# Patient Record
Sex: Male | Born: 1990
Health system: Southern US, Community
[De-identification: ages and names within clinical notes are randomized; demographics above are authoritative.]

## PROBLEM LIST (undated history)

## (undated) DIAGNOSIS — J189 Pneumonia, unspecified organism: Secondary | ICD-10-CM

---

## 2017-03-09 ENCOUNTER — Inpatient Hospital Stay (HOSPITAL_COMMUNITY): Payer: 59

## 2017-03-09 ENCOUNTER — Encounter (HOSPITAL_COMMUNITY): Payer: Self-pay | Admitting: Emergency Medicine

## 2017-03-09 ENCOUNTER — Emergency Department (HOSPITAL_COMMUNITY): Payer: 59

## 2017-03-09 ENCOUNTER — Inpatient Hospital Stay (HOSPITAL_COMMUNITY)
Admission: EM | Admit: 2017-03-09 | Discharge: 2017-03-14 | DRG: 871 | Disposition: A | Discharge status: Home / Self Care | Payer: 59 | Attending: Internal Medicine | Admitting: Internal Medicine

## 2017-03-09 DIAGNOSIS — R042 Hemoptysis: Secondary | ICD-10-CM

## 2017-03-09 DIAGNOSIS — R7989 Other specified abnormal findings of blood chemistry: Secondary | ICD-10-CM | POA: Diagnosis not present

## 2017-03-09 DIAGNOSIS — R05 Cough: Secondary | ICD-10-CM | POA: Diagnosis not present

## 2017-03-09 DIAGNOSIS — J181 Lobar pneumonia, unspecified organism: Secondary | ICD-10-CM | POA: Diagnosis not present

## 2017-03-09 DIAGNOSIS — R509 Fever, unspecified: Secondary | ICD-10-CM | POA: Diagnosis not present

## 2017-03-09 DIAGNOSIS — J189 Pneumonia, unspecified organism: Secondary | ICD-10-CM

## 2017-03-09 DIAGNOSIS — R0902 Hypoxemia: Secondary | ICD-10-CM | POA: Diagnosis not present

## 2017-03-09 DIAGNOSIS — J9601 Acute respiratory failure with hypoxia: Secondary | ICD-10-CM | POA: Diagnosis present

## 2017-03-09 DIAGNOSIS — B9789 Other viral agents as the cause of diseases classified elsewhere: Secondary | ICD-10-CM | POA: Diagnosis present

## 2017-03-09 DIAGNOSIS — R0600 Dyspnea, unspecified: Secondary | ICD-10-CM | POA: Diagnosis not present

## 2017-03-09 DIAGNOSIS — A419 Sepsis, unspecified organism: Principal | ICD-10-CM

## 2017-03-09 DIAGNOSIS — R0789 Other chest pain: Secondary | ICD-10-CM | POA: Diagnosis present

## 2017-03-09 DIAGNOSIS — N179 Acute kidney failure, unspecified: Secondary | ICD-10-CM | POA: Diagnosis not present

## 2017-03-09 DIAGNOSIS — E872 Acidosis: Secondary | ICD-10-CM | POA: Diagnosis present

## 2017-03-09 DIAGNOSIS — R0682 Tachypnea, not elsewhere classified: Secondary | ICD-10-CM | POA: Diagnosis not present

## 2017-03-09 DIAGNOSIS — R079 Chest pain, unspecified: Secondary | ICD-10-CM

## 2017-03-09 DIAGNOSIS — R000 Tachycardia, unspecified: Secondary | ICD-10-CM | POA: Diagnosis present

## 2017-03-09 DIAGNOSIS — J1289 Other viral pneumonia: Secondary | ICD-10-CM | POA: Diagnosis not present

## 2017-03-09 DIAGNOSIS — R0602 Shortness of breath: Secondary | ICD-10-CM

## 2017-03-09 LAB — BASIC METABOLIC PANEL
Anion gap: 11 (ref 5–15)
BUN: 17 mg/dL (ref 6–20)
CO2: 23 mmol/L (ref 22–32)
Calcium: 8 mg/dL — ABNORMAL LOW (ref 8.9–10.3)
Chloride: 106 mmol/L (ref 101–111)
Creatinine, Ser: 1.26 mg/dL — ABNORMAL HIGH (ref 0.61–1.24)
GFR calc Af Amer: >60 mL/min (ref >60)
Glucose, Bld: 100 mg/dL — ABNORMAL HIGH (ref 65–99)
POTASSIUM: 4.4 mmol/L (ref 3.5–5.1)
SODIUM: 140 mmol/L (ref 135–145)

## 2017-03-09 LAB — I-STAT CG4 LACTIC ACID, ED
LACTIC ACID, VENOUS: 4.87 mmol/L — AB (ref 0.5–1.9)
Lactic Acid, Venous: 3.96 mmol/L (ref 0.5–1.9)

## 2017-03-09 LAB — COMPREHENSIVE METABOLIC PANEL
ALBUMIN: 3.8 g/dL (ref 3.5–5.0)
ALK PHOS: 58 U/L (ref 38–126)
ALT: 20 U/L (ref 17–63)
AST: 31 U/L (ref 15–41)
Anion gap: 13 (ref 5–15)
BILIRUBIN TOTAL: 1.8 mg/dL — AB (ref 0.3–1.2)
BUN: 21 mg/dL — AB (ref 6–20)
CALCIUM: 8.8 mg/dL — AB (ref 8.9–10.3)
CO2: 20 mmol/L — AB (ref 22–32)
CREATININE: 1.6 mg/dL — AB (ref 0.61–1.24)
Chloride: 103 mmol/L (ref 101–111)
GFR calc Af Amer: >60 mL/min (ref >60)
GFR calc non Af Amer: 58 mL/min — ABNORMAL LOW (ref >60)
GLUCOSE: 108 mg/dL — AB (ref 65–99)
Potassium: 4.2 mmol/L (ref 3.5–5.1)
SODIUM: 136 mmol/L (ref 135–145)
TOTAL PROTEIN: 6.8 g/dL (ref 6.5–8.1)

## 2017-03-09 LAB — URINALYSIS, ROUTINE W REFLEX MICROSCOPIC
BACTERIA UA: NONE SEEN
BILIRUBIN URINE: NEGATIVE
GLUCOSE, UA: NEGATIVE mg/dL
HGB URINE DIPSTICK: NEGATIVE
Ketones, ur: NEGATIVE mg/dL
LEUKOCYTES UA: NEGATIVE
NITRITE: NEGATIVE
PROTEIN: 100 mg/dL — AB
SPECIFIC GRAVITY, URINE: 1.028 (ref 1.005–1.030)
pH: 5 (ref 5.0–8.0)

## 2017-03-09 LAB — CBC
HCT: 42.9 % (ref 39.0–52.0)
Hemoglobin: 15.1 g/dL (ref 13.0–17.0)
MCH: 28.4 pg (ref 26.0–34.0)
MCHC: 35.2 g/dL (ref 30.0–36.0)
MCV: 80.6 fL (ref 78.0–100.0)
Platelets: 245 10*3/uL (ref 150–400)
RBC: 5.32 MIL/uL (ref 4.22–5.81)
RDW: 13.3 % (ref 11.5–15.5)
WBC: 16.1 10*3/uL — ABNORMAL HIGH (ref 4.0–10.5)

## 2017-03-09 LAB — DIFFERENTIAL
BASOS PCT: 0 %
Basophils Absolute: 0 10*3/uL (ref 0.0–0.1)
EOS PCT: 0 %
Eosinophils Absolute: 0 10*3/uL (ref 0.0–0.7)
LYMPHS ABS: 0.6 10*3/uL — AB (ref 0.7–4.0)
Lymphocytes Relative: 4 %
MONOS PCT: 6 %
Monocytes Absolute: 1 10*3/uL (ref 0.1–1.0)
Neutro Abs: 14.5 10*3/uL — ABNORMAL HIGH (ref 1.7–7.7)
Neutrophils Relative %: 90 %

## 2017-03-09 LAB — INFLUENZA PANEL BY PCR (TYPE A & B)
Influenza A By PCR: NEGATIVE
Influenza B By PCR: NEGATIVE

## 2017-03-09 LAB — I-STAT TROPONIN, ED: Troponin i, poc: 0 ng/mL (ref 0.00–0.08)

## 2017-03-09 LAB — MRSA PCR SCREENING: MRSA BY PCR: NEGATIVE

## 2017-03-09 LAB — D-DIMER, QUANTITATIVE (NOT AT ARMC): D DIMER QUANT: 0.51 ug{FEU}/mL — AB (ref 0.00–0.50)

## 2017-03-09 LAB — LIPASE, BLOOD: Lipase: 19 U/L (ref 11–51)

## 2017-03-09 LAB — D-DIMER, QUANTITATIVE: D-Dimer, Quant: 0.81 ug/mL-FEU — ABNORMAL HIGH (ref 0.00–0.50)

## 2017-03-09 MED ORDER — ALBUTEROL SULFATE (2.5 MG/3ML) 0.083% IN NEBU: 3.0000 mL | INHALATION_SOLUTION | Freq: Four times a day (QID) | RESPIRATORY_TRACT | Status: DC | PRN

## 2017-03-09 MED ORDER — ONDANSETRON HCL 4 MG/2ML IJ SOLN
4.0000 mg | Freq: Once | INTRAMUSCULAR | Status: AC
  Administered 2017-03-09: 4 mg via INTRAVENOUS
  Filled 2017-03-09: qty 2

## 2017-03-09 MED ORDER — BENZONATATE 100 MG PO CAPS
100.0000 mg | ORAL_CAPSULE | Freq: Three times a day (TID) | ORAL | Status: DC | PRN
  Administered 2017-03-10 – 2017-03-14 (×10): 100 mg via ORAL
  Filled 2017-03-09 (×13): qty 1

## 2017-03-09 MED ORDER — ACETAMINOPHEN 325 MG PO TABS
650.0000 mg | ORAL_TABLET | Freq: Four times a day (QID) | ORAL | Status: DC | PRN
  Administered 2017-03-09 – 2017-03-13 (×10): 650 mg via ORAL
  Filled 2017-03-09 (×11): qty 2

## 2017-03-09 MED ORDER — VANCOMYCIN HCL IN DEXTROSE 1-5 GM/200ML-% IV SOLN
1000.0000 mg | Freq: Once | INTRAVENOUS | Status: AC
  Administered 2017-03-09: 1000 mg via INTRAVENOUS
  Filled 2017-03-09: qty 200

## 2017-03-09 MED ORDER — VANCOMYCIN HCL IN DEXTROSE 750-5 MG/150ML-% IV SOLN: 750.0000 mg | Freq: Two times a day (BID) | INTRAVENOUS | Status: DC

## 2017-03-09 MED ORDER — ACETAMINOPHEN 650 MG RE SUPP: 650.0000 mg | Freq: Four times a day (QID) | RECTAL | Status: DC | PRN

## 2017-03-09 MED ORDER — AZITHROMYCIN 500 MG PO TABS
500.0000 mg | ORAL_TABLET | Freq: Every day | ORAL | Status: AC
  Administered 2017-03-09: 500 mg via ORAL
  Filled 2017-03-09: qty 1

## 2017-03-09 MED ORDER — VANCOMYCIN HCL IN DEXTROSE 1-5 GM/200ML-% IV SOLN: 1000.0000 mg | Freq: Three times a day (TID) | INTRAVENOUS | Status: DC

## 2017-03-09 MED ORDER — LACTATED RINGERS IV BOLUS (SEPSIS)
1000.0000 mL | Freq: Once | INTRAVENOUS | Status: AC
  Administered 2017-03-09: 1000 mL via INTRAVENOUS

## 2017-03-09 MED ORDER — DEXTROSE 5 % IV SOLN
2.0000 g | INTRAVENOUS | Status: DC
  Administered 2017-03-09: 2 g via INTRAVENOUS
  Filled 2017-03-09: qty 2

## 2017-03-09 MED ORDER — SENNOSIDES-DOCUSATE SODIUM 8.6-50 MG PO TABS: 1.0000 | ORAL_TABLET | Freq: Every evening | ORAL | Status: DC | PRN

## 2017-03-09 MED ORDER — SODIUM CHLORIDE 0.9 % IV SOLN
INTRAVENOUS | Status: AC
  Administered 2017-03-09: 125 mL/h via INTRAVENOUS
  Administered 2017-03-10 (×2): via INTRAVENOUS

## 2017-03-09 MED ORDER — LACTATED RINGERS IV BOLUS (SEPSIS): 1000.0000 mL | Freq: Once | INTRAVENOUS | Status: DC

## 2017-03-09 MED ORDER — PIPERACILLIN-TAZOBACTAM 3.375 G IVPB: 3.3750 g | Freq: Three times a day (TID) | INTRAVENOUS | Status: DC

## 2017-03-09 MED ORDER — SODIUM CHLORIDE 0.9 % IV BOLUS (SEPSIS)
1000.0000 mL | Freq: Once | INTRAVENOUS | Status: AC
  Administered 2017-03-09: 1000 mL via INTRAVENOUS

## 2017-03-09 MED ORDER — GUAIFENESIN-CODEINE 100-10 MG/5ML PO SOLN
5.0000 mL | Freq: Four times a day (QID) | ORAL | Status: DC | PRN
  Administered 2017-03-09 – 2017-03-14 (×15): 5 mL via ORAL
  Filled 2017-03-09 (×16): qty 5

## 2017-03-09 MED ORDER — SODIUM CHLORIDE 0.9 % IV SOLN: Freq: Once | INTRAVENOUS | Status: DC

## 2017-03-09 MED ORDER — IOPAMIDOL (ISOVUE-370) INJECTION 76%
INTRAVENOUS | Status: AC
  Administered 2017-03-09: 100 mL
  Filled 2017-03-09: qty 100

## 2017-03-09 MED ORDER — LACTATED RINGERS IV BOLUS (SEPSIS)
500.0000 mL | Freq: Once | INTRAVENOUS | Status: AC
  Administered 2017-03-09: 500 mL via INTRAVENOUS

## 2017-03-09 MED ORDER — AZITHROMYCIN 500 MG PO TABS
250.0000 mg | ORAL_TABLET | Freq: Every day | ORAL | Status: AC
  Administered 2017-03-10 – 2017-03-13 (×4): 250 mg via ORAL
  Filled 2017-03-09 (×5): qty 1

## 2017-03-09 MED ORDER — PIPERACILLIN-TAZOBACTAM 3.375 G IVPB 30 MIN
3.3750 g | Freq: Once | INTRAVENOUS | Status: AC
  Administered 2017-03-09: 3.375 g via INTRAVENOUS
  Filled 2017-03-09: qty 50

## 2017-03-09 MED ORDER — ALBUTEROL SULFATE (2.5 MG/3ML) 0.083% IN NEBU
2.5000 mg | INHALATION_SOLUTION | Freq: Four times a day (QID) | RESPIRATORY_TRACT | Status: DC | PRN
  Administered 2017-03-09 – 2017-03-11 (×3): 2.5 mg via RESPIRATORY_TRACT
  Filled 2017-03-09 (×3): qty 3

## 2017-03-09 MED ORDER — ACETAMINOPHEN 500 MG PO TABS
1000.0000 mg | ORAL_TABLET | Freq: Once | ORAL | Status: AC
  Administered 2017-03-09: 1000 mg via ORAL
  Filled 2017-03-09: qty 2

## 2017-03-09 MED ORDER — VANCOMYCIN HCL IN DEXTROSE 1-5 GM/200ML-% IV SOLN
1000.0000 mg | Freq: Once | INTRAVENOUS | Status: DC
  Filled 2017-03-09: qty 200

## 2017-03-09 MED ORDER — ALBUTEROL SULFATE (2.5 MG/3ML) 0.083% IN NEBU
2.5000 mg | INHALATION_SOLUTION | Freq: Four times a day (QID) | RESPIRATORY_TRACT | Status: DC
  Administered 2017-03-10 – 2017-03-11 (×5): 2.5 mg via RESPIRATORY_TRACT
  Filled 2017-03-09 (×5): qty 3

## 2017-03-09 NOTE — ED Notes (Signed)
Patient transported to X-ray 

## 2017-03-09 NOTE — Progress Notes (Addendum)
Pharmacy Antibiotic Note  Joshua Villegas is a 26 y.o. male admitted on 03/09/2017 with sepsis.  Pharmacy has been consulted for vancomycin and Zosyn dosing.  Patient has had cough, fever, and nasal congestion x3 days.  SCr 1.6- assume patient has small AKI based on age and no significant medical history. nCrCl ~70-3575mL/min  Plan: Vancomycin 1g IV and Zosyn 3.375g IV each x1 ordered by EDP Vancomycin 1g IV x1 again to complete load, then 750mg  IV q12h per obesity nomogram Zosyn 3.375g IV q8h EI Follow c/s, clinical progression, renal function, level at steady state   Height: 6\' 1"  (185.4 cm) Weight: 235 lb (106.6 kg) IBW/kg (Calculated) : 79.9  Temp (24hrs), Avg:100.4 F (38 C), Min:100.4 F (38 C), Max:100.4 F (38 C)  Recent Labs  Lab 03/09/17 1403 03/09/17 1424  WBC 16.1*  --   LATICACIDVEN  --  4.87*    CrCl cannot be calculated (No order found.).    Allergies not on file  Vanc 12/9>> Zosyn 12/9>>  12/9 BCx:  Thank you for allowing pharmacy to be a part of this patient's care.  Willam Munford D. Chenika Nevils, PharmD, BCPS Clinical Pharmacist Clinical Phone for 03/09/2017 until 3:30pm: [phone] If after 3:30pm, please call main pharmacy at [phone] 03/09/2017 2:48 PM

## 2017-03-09 NOTE — ED Triage Notes (Signed)
Pt. Stated, ai started having cough, congestion, fever, diarrhea, N/V 3 days ago. And I started having some blood when I cough.

## 2017-03-09 NOTE — ED Provider Notes (Addendum)
[facility] EMERGENCY DEPARTMENT Provider Note   CSN: [id number] Arrival date & time: 03/09/17  1339     History   Chief Complaint Chief Complaint  Patient presents with  . Code Sepsis  . Cough  . Chest Pain  . Fever  . Emesis    HPI Joshua Villegas is a 26 y.o. male.  HPI   26 yo M with no significant PMHx here with nasal congestion, cough, CP. Pt reports his sx started 3 days ago with acute onset of cough, fever, and nausea with vomiting. He was travelling in [place] at the time. Since then, he reports he's developed worsening cough with a sharp, pleuritic CP. He initially produced yellow-green sputum but is now producing bloody hemoptysis. He states he's coughed up "a lot" of blood. Denies any h/o similar sx. No smoking or alcohol use. He's had fevers. He's been unable to eat/drink much 2/2 his nausea and vomiting. His daughter does have "a cold" but no sx similar in severity. No h/o blood clots.  History reviewed. No pertinent past medical history.  There are no active problems to display for this patient.   History reviewed. No pertinent surgical history.     Home Medications    Prior to Admission medications   Medication Sig Start Date End Date Taking? Authorizing Provider  ibuprofen (ADVIL,MOTRIN) 200 MG tablet Take 400 mg by mouth every 6 (six) hours as needed for fever, headache or mild pain.   Yes [provider]    Family History No family history on file.  Social History Social History   Tobacco Use  . Smoking status: Never Smoker  . Smokeless tobacco: Never Used  Substance Use Topics  . Alcohol use: Yes  . Drug use: No     Allergies   Patient has no known allergies.   Review of Systems Review of Systems  Constitutional: Positive for chills, fatigue and fever.  HENT: Positive for congestion and rhinorrhea.   Respiratory: Positive for cough and shortness of breath.   Cardiovascular: Positive for chest pain.    Gastrointestinal: Positive for nausea and vomiting.  Neurological: Positive for weakness.  All other systems reviewed and are negative.    Physical Exam Updated Vital Signs BP (!) 110/43   Pulse (!) 102   Temp (!) 100.4 F (38 C) (Oral)   Resp 19   Ht 6\' 1"  (1.854 m)   Wt 106.6 kg (235 lb)   SpO2 96%   BMI 31.00 kg/m   Physical Exam  Constitutional: He is oriented to person, place, and time. He appears well-developed and well-nourished. He appears distressed.  HENT:  Head: Normocephalic and atraumatic.  Dry MM. Moderate posterior pharyngeal erythema. No tonsillar asymmetry or swelling.  Eyes: Conjunctivae are normal.  Neck: Neck supple.  Cardiovascular: Normal rate, regular rhythm and normal heart sounds. Exam reveals no friction rub.  No murmur heard. Pulmonary/Chest: Effort normal. Tachypnea noted. No respiratory distress. He has decreased breath sounds (diffuse) in the right middle field, the right lower field and the left lower field. He has no wheezes. He has rales in the right middle field and the right lower field.  Abdominal: He exhibits no distension.  Musculoskeletal: He exhibits no edema.  Neurological: He is alert and oriented to person, place, and time. He exhibits normal muscle tone.  Skin: Skin is warm. Capillary refill takes less than 2 seconds.  Psychiatric: He has a normal mood and affect.  Nursing note and vitals reviewed.  ED Treatments / Results  Labs (all labs ordered are listed, but only abnormal results are displayed) Labs Reviewed  COMPREHENSIVE METABOLIC PANEL - Abnormal; Notable for the following components:      Result Value   CO2 20 (*)    Glucose, Bld 108 (*)    BUN 21 (*)    Creatinine, Ser 1.60 (*)    Calcium 8.8 (*)    Total Bilirubin 1.8 (*)    GFR calc non Af Amer 58 (*)    All other components within normal limits  CBC - Abnormal; Notable for the following components:   WBC 16.1 (*)    All other components within normal  limits  D-DIMER, QUANTITATIVE (NOT AT [facility]) - Abnormal; Notable for the following components:   D-Dimer, Quant 0.51 (*)    All other components within normal limits  I-STAT CG4 LACTIC ACID, ED - Abnormal; Notable for the following components:   Lactic Acid, Venous 4.87 (*)    All other components within normal limits  CULTURE, BLOOD (ROUTINE X 2)  CULTURE, BLOOD (ROUTINE X 2)  LIPASE, BLOOD  URINALYSIS, ROUTINE W REFLEX MICROSCOPIC  INFLUENZA PANEL BY PCR (TYPE A & B)  DIFFERENTIAL  I-STAT TROPONIN, ED  I-STAT CG4 LACTIC ACID, ED    EKG  EKG Interpretation  Date/Time:  Sunday March 09 2017 13:45:24 EST Ventricular Rate:  124 PR Interval:  126 QRS Duration: 92 QT Interval:  302 QTC Calculation: 433 R Axis:   63 Text Interpretation:  Sinus tachycardia RSR' or QR pattern in V1 suggests right ventricular conduction delay T wave abnormality, consider inferolateral ischemia Abnormal ECG No old tracing to compare Changes consistent with possible demand ischemia, likely rate related No ST elevations S1Q3T3 noted Confirmed by Shaune PollackIsaacs, Cassie Shedlock [phone]) on 03/09/2017 1:52:34 PM Also confirmed by Shaune PollackIsaacs, Tiah Heckel [phone]), editor Madalyn RobEverhart, Marilyn [phone])  on 03/09/2017 3:03:55 PM       Radiology Dg Chest 2 View  Result Date: 03/09/2017 CLINICAL DATA:  Cough with blood-tinged sputum.  Chest pain.  Fever. EXAM: CHEST  2 VIEW COMPARISON:  None. FINDINGS: There is airspace consolidation throughout the anterior segment right upper lobe consistent with pneumonia. There is patchy opacity in the left base. Lungs elsewhere are clear. Heart size and pulmonary vascularity are normal. No adenopathy. No bone lesions. IMPRESSION: Airspace consolidation throughout the anterior segment right upper lobe without appreciable volume loss. This finding is consistent with pneumonia. There is rather subtle patchy opacity in the left base, likely a much smaller focus of pneumonia in this region. No adenopathy  appreciable. Electronically Signed   By: Bretta BangWilliam  Woodruff III M.D.   On: 03/09/2017 14:47    Procedures .Critical Care Performed by: Shaune PollackIsaacs, Cai Flott, MD Authorized by: Shaune PollackIsaacs, Nicle Connole, MD   Critical care provider statement:    Critical care time (minutes):  35   Critical care time was exclusive of:  Separately billable procedures and treating other patients and teaching time   Critical care was necessary to treat or prevent imminent or life-threatening deterioration of the following conditions:  Circulatory failure, sepsis and respiratory failure   Critical care was time spent personally by me on the following activities:  Development of treatment plan with patient or surrogate, discussions with consultants, evaluation of patient's response to treatment, examination of patient, obtaining history from patient or surrogate, ordering and performing treatments and interventions, ordering and review of laboratory studies, ordering and review of radiographic studies, pulse oximetry, re-evaluation of patient's condition and review of old  charts   I assumed direction of critical care for this patient from another provider in my specialty: no     (including critical care time)  Medications Ordered in ED Medications  vancomycin (VANCOCIN) IVPB 1000 mg/200 mL premix (1,000 mg Intravenous New Bag/Given 03/09/17 1508)  lactated ringers bolus 1,000 mL (1,000 mLs Intravenous New Bag/Given 03/09/17 1500)  lactated ringers bolus 500 mL (500 mLs Intravenous New Bag/Given 03/09/17 1459)  vancomycin (VANCOCIN) IVPB 1000 mg/200 mL premix (not administered)  piperacillin-tazobactam (ZOSYN) IVPB 3.375 g (not administered)  sodium chloride 0.9 % bolus 1,000 mL (1,000 mLs Intravenous New Bag/Given 03/09/17 1413)  sodium chloride 0.9 % bolus 1,000 mL (1,000 mLs Intravenous New Bag/Given 03/09/17 1414)  acetaminophen (TYLENOL) tablet 1,000 mg (1,000 mg Oral Given 03/09/17 1413)  ondansetron (ZOFRAN) injection 4 mg (4 mg  Intravenous Given 03/09/17 1413)  piperacillin-tazobactam (ZOSYN) IVPB 3.375 g (3.375 g Intravenous New Bag/Given 03/09/17 1506)     Initial Impression / Assessment and Plan / ED Course  I have reviewed the triage vital signs and the nursing notes.  Pertinent labs & imaging results that were available during my care of the patient were reviewed by me and considered in my medical decision making (see chart for details).     3:38 PM 26 yo M here with acute onset cough, myalgias, fever, n/v. He is febrile and tachycardic on arrival, in mild distress but non-toxic. Primary suspicion is influenza/ILI with possible PNA given his hemoptysis. However, he does report pleuritic CP with hemoptysis and EKG is c/f S1Q3T3. While this may be demand in setting of his SIRS response, must also consider PE. D-Dimer ordered. Will start fluids, tylenol, send LA and re-assess. Holding on ABX given viral like sx at this time but low threshold to add if LA elevated or CXR shows PNA. Sx started over last 48 hours, so time course is not c/w post-viral staph PNA but this is also a consideration - will f/u CXR.  3:38 PM LA elevated, code sepsis initiated with broad-spectrum ABX as source remains unclear at this time. CXR, labs o/w pending. He remains HDS, though tachycardic.  3:38 PM Reviewed CXR. Pt has focal, lobar PNA c/f post-viral PNA. Vanc/ broad-spectrum ABX have been given. IVF given. He is satting well on RA, o/w stable. Will plan for admission. Of note, D-Dimer 0.51 - given his CXR and lab findings, suspect PNA is likely etiology. Moreover, cannot obtain CT Angio 2/2 AKI. Will hydrate, monitor. I think that if he continues to improve with fluids, ABX, and has no hypoxia, can likely hold on CT.  ADDENDUM: LA improving but remains elevated - will continue fluids. Pt remains non-toxic, protecting airway on exam. BP 100s systolic. HR improving. CT Angio ordered as I think this will eval PE, also better delineate extent of  his pulm disease. IM to admit, has seen pt and in agreement. He is satting >92% on RA at this time. Will likely need Step Down due to potential for decompensation, but given normal O2 sats on RA, normal WOB, BP >100 systolic, do not feel ICU needed at this time.  Final Clinical Impressions(s) / ED Diagnoses   Final diagnoses:  Sepsis due to pneumonia [facility](HCC)    ED Discharge Orders    None       Shaune PollackIsaacs, Damaya Channing, MD 03/09/17 1538    Shaune PollackIsaacs, Agueda Houpt, MD 03/09/17 1700    Shaune PollackIsaacs, Norvel Wenker, MD 03/10/17 1131

## 2017-03-09 NOTE — ED Notes (Signed)
I Stat Lactic Acid results shown to Dr. Isaacs 

## 2017-03-09 NOTE — H&P (Signed)
Date: 03/09/2017               Patient Name:  Joshua Villegas MRN: [id number]  DOB: [birth year] Age / Sex: 26 y.o., male   PCP: Patient, No Pcp Per         Medical Service: Internal Medicine Teaching Service         Attending Physician: Dr. Duffy Bruce, MD    First Contact: Dr. Berline Lopes Pager: [phone]  Second Contact: Dr. Heber Thornton Pager: [phone]       After Hours (After 5p/  First Contact Pager: [phone]  weekends / holidays): Second Contact Pager: [phone]   Chief Complaint: chest pain, cough and shortness of breath  History of Present Illness: Joshua Villegas is a 26 year old male who presents today with dyspnea, hemoptysis, chest pain, fever, vomiting, anorexia, generalized fatigue 3 days.  He denies a past medical history and denies taking medication for any medical illness.  The patient states that prior to his return trip from [place] early Friday morning (12/07) he developed a cough productive of thick mucous.  Upon returning to the area that evening his cough mildly worsened and he had one episode of mild hemoptysis.  The following morning December 8, he developed worsening hemoptysis accompanied by thick mucus, midsternal and right mid chest pain, fever, headache, nausea dizziness and chills.  He states that he coughed up approximately 0.5 of a gallon of blood and thick mucus initially. This later began to be associated with less mucus which was replaced with sputum greater amounts of blood.    The patient denied visual changes, abdominal pain, dysuria, hematuria, hematemesis, melena, or discolored stool.  In the ED: CBC indicated a leukocytosis of 16.1 w/ H/H of 15.1/42.9. CMP Na 136, K 4.2, CO2 20, BUN 21, Cr 1.60 and bilirubin of 1.8. In addition a D-dimer was mildly elevated to 0.51 with chest X-ray remarkable for airspace consolidation of the anterior segment of the right upper lobe without notable volume loss.  EKG was remarkable for sinus tach with no S1, but a Q3T3 again  possible with right heart strain.  Blood cultures were drawn and pending.  Troponin was negative 0.00, however, lactic acid was 4.87. Admission request was placed for unassigned with the IMTS team accepting.   Meds:  Current Meds  Medication Sig  . ibuprofen (ADVIL,MOTRIN) 200 MG tablet Take 400 mg by mouth every 6 (six) hours as needed for fever, headache or mild pain.    Allergies: Allergies as of 03/09/2017  . (No Known Allergies)   History reviewed. No pertinent past medical history.  Family History:  Denied known history of medical conditions in his family  Social History:  Denied Tobacco or illicit drugs Attested to 5-6 EtOH drinks per week  Review of Systems: A complete ROS was negative except as per HPI.   Physical Exam: Blood pressure (!) 103/59, pulse (!) 102, temperature (!) 100.4 F (38 C), temperature source Oral, resp. rate (!) 23, height _0  (1.854 m), weight 235 lb (106.6 kg), SpO2 96 %. Physical Exam  Constitutional: He appears well-nourished. He does not appear ill. No distress.  Cardiovascular: Tachycardia present.  No murmur heard. Pulmonary/Chest: No accessory muscle usage. No tachypnea. No respiratory distress. He has decreased breath sounds in the right middle field. He has rhonchi in the right middle field.  Musculoskeletal:       Right lower leg: He exhibits no tenderness and no edema.       Left  lower leg: He exhibits no tenderness and no edema.  Skin: Capillary refill takes less than 2 seconds. No erythema.  Nursing note and vitals reviewed.  EKG: personally reviewed my interpretation is sinus tachycardia with S1Q3T3 positive findings.  CXR: personally reviewed my interpretation is that there is notable consolidation of the anterior segment of the right upper lobe with mild opacities of the left bottom lobe.  Assessment & Plan by Problem: Active Problems:   Community acquired pneumonia   Cough with hemoptysis   Chest pain  Check millimeters  26 year old male who presents with right-sided chest pain, dyspnea, chest tightness, fever, chills, productive cough.  Given his presentation is most likely suffering from moderately severe community acquired pneumonia with high suspicion for pulmonary embolism also of concern.  Given the patient's chest pain, hemoptysis, chest x-ray findings, dyspnea, tachycardia, D-dimer(+) and EKG findings of no S1 with Q3T3 pulmonary embolism should be ruled out with CT angiogram. However it should be noted that given the fever, cough, and chest x-ray findings the patient is likely suffering from CAP which is supported by the findings of purulent sputum, leukocytosis, fever, cough, DG chest and lactic acidosis.  Included in the differential for pneumonia, pulmonary embolism, ACS, costochondritis, influenza, atypical infections.  Community-acquired pneumonia: It is highly probable that the patient is suffering from community-acquired pneumonia given the airspace consolidation on chest x-ray, leukocytosis, cough, fever, general presentation.  Upon admission patient met initial sepsis criteria being febrile to 100.4, tachycardic, mildly tachypneic, with a developing lactic acidosis noted as well.  Patient's blood pressure has remained stable with systolic in the 950D diastolic approximately 32I. -Patient was given a dose of Vanco and Zosyn in the ED. -Given the patient's mild AKI with a creatinine of 1.60, vancomycin and Zosyn were held in favor of ceftriaxone and azithromycin.  -BCx were drawn and are pending. -Fluid rehydration with 3 L continuous flow of 100 mL's an hour was initiated as per sepsis protocol. -Albuterol nebulizer 2.43m3 mL as needed up to every 6 hours for wheezing shortness of breath. -Acetaminophen 650 mg as needed for pain -Cough medicine codeine 100-10 and a 5 mL solution as needed for cough -BMP repeat creatinine electrolytes for 8 PM and 5 AM tomorrow Continuous cardiac monitoring given the  patient's chest pain -CBC for a.m. -CT angiogram-discussed with patient the risks vs benefits of a CT given his renal function and creatinine of 1.6 which was explained to be significantly above the baseline normal. He was also informed that since he had received multiple liters of fluid this would assist with "flushing" his kidneys and that we would follow with additional fluids. He nodded in agreement with the plan to move forward with the CT to rule out PE and better evaluate his pulmonary status.   Cough with hemoptysis: Most likely secondary to severe prolonged coughing irritation of the bronchi trachea and oropharynx.  However this could be secondary to wedge infarct/CT angiogram is pending -Patient given benzonatate capsules for cough  -Guaifenesin-codeine 100-10 mg / 5 mL  Chest pain: This is most likely secondary to the patient's severe cough and pneumonia is pleuritic in nature, however, pulmonary embolism cannot be ruled out clinically, CT angiogram results pending for better determination. -Initial troponin negative after >36 hours of atypical chest pain -No evidence of ST-elevation/depression on EKG consistent with ACS -Acetaminophen 600 mg as needed for pain -Guaifenesin-codeine 100-10 mg / 5 mL for cough to reduce worsening chest pain    Diet: Regular when tolerated  Code: full Fluids: 100 mL normal saline an hour DVT PPX: Holding currently will evaluate once CT angiogram is performed Dispo: Admit patient to Inpatient with expected length of stay greater than 2 midnights.  Signed: Kathi Ludwig, MD 03/09/2017, 5:16 PM  Pager: Pager# [phone]

## 2017-03-10 ENCOUNTER — Other Ambulatory Visit: Payer: Self-pay

## 2017-03-10 ENCOUNTER — Encounter (HOSPITAL_COMMUNITY): Payer: Self-pay | Admitting: *Deleted

## 2017-03-10 ENCOUNTER — Inpatient Hospital Stay (HOSPITAL_COMMUNITY): Payer: 59

## 2017-03-10 DIAGNOSIS — J189 Pneumonia, unspecified organism: Secondary | ICD-10-CM

## 2017-03-10 DIAGNOSIS — R0682 Tachypnea, not elsewhere classified: Secondary | ICD-10-CM

## 2017-03-10 DIAGNOSIS — A419 Sepsis, unspecified organism: Secondary | ICD-10-CM

## 2017-03-10 DIAGNOSIS — J181 Lobar pneumonia, unspecified organism: Secondary | ICD-10-CM

## 2017-03-10 DIAGNOSIS — R0902 Hypoxemia: Secondary | ICD-10-CM

## 2017-03-10 LAB — BLOOD GAS, ARTERIAL
Acid-base deficit: 3 mmol/L — ABNORMAL HIGH (ref 0.0–2.0)
Bicarbonate: 21.2 mmol/L (ref 20.0–28.0)
DRAWN BY: 51806
FIO2: 100
O2 SAT: 96 %
PATIENT TEMPERATURE: 98.6
pCO2 arterial: 36.2 mmHg (ref 32.0–48.0)
pH, Arterial: 7.385 (ref 7.350–7.450)
pO2, Arterial: 80.9 mmHg — ABNORMAL LOW (ref 83.0–108.0)

## 2017-03-10 LAB — BASIC METABOLIC PANEL
ANION GAP: 9 (ref 5–15)
BUN: 18 mg/dL (ref 6–20)
CHLORIDE: 106 mmol/L (ref 101–111)
CO2: 23 mmol/L (ref 22–32)
Calcium: 7.9 mg/dL — ABNORMAL LOW (ref 8.9–10.3)
Creatinine, Ser: 1.11 mg/dL (ref 0.61–1.24)
GFR calc Af Amer: >60 mL/min (ref >60)
GFR calc non Af Amer: >60 mL/min (ref >60)
Glucose, Bld: 100 mg/dL — ABNORMAL HIGH (ref 65–99)
POTASSIUM: 3.6 mmol/L (ref 3.5–5.1)
SODIUM: 138 mmol/L (ref 135–145)

## 2017-03-10 LAB — CBC
HEMATOCRIT: 36 % — AB (ref 39.0–52.0)
HEMOGLOBIN: 12.4 g/dL — AB (ref 13.0–17.0)
MCH: 27.7 pg (ref 26.0–34.0)
MCHC: 34.4 g/dL (ref 30.0–36.0)
MCV: 80.4 fL (ref 78.0–100.0)
Platelets: 173 10*3/uL (ref 150–400)
RBC: 4.48 MIL/uL (ref 4.22–5.81)
RDW: 13.6 % (ref 11.5–15.5)
WBC: 13.3 10*3/uL — AB (ref 4.0–10.5)

## 2017-03-10 LAB — RESPIRATORY PANEL BY PCR
ADENOVIRUS-RVPPCR: NOT DETECTED
Bordetella pertussis: NOT DETECTED
CHLAMYDOPHILA PNEUMONIAE-RVPPCR: NOT DETECTED
CORONAVIRUS NL63-RVPPCR: NOT DETECTED
Coronavirus 229E: NOT DETECTED
Coronavirus HKU1: NOT DETECTED
Coronavirus OC43: NOT DETECTED
INFLUENZA A-RVPPCR: NOT DETECTED
INFLUENZA B-RVPPCR: NOT DETECTED
Metapneumovirus: NOT DETECTED
Mycoplasma pneumoniae: NOT DETECTED
PARAINFLUENZA VIRUS 1-RVPPCR: NOT DETECTED
PARAINFLUENZA VIRUS 2-RVPPCR: NOT DETECTED
Parainfluenza Virus 3: NOT DETECTED
Parainfluenza Virus 4: NOT DETECTED
RHINOVIRUS / ENTEROVIRUS - RVPPCR: DETECTED — AB
Respiratory Syncytial Virus: NOT DETECTED

## 2017-03-10 LAB — HIV ANTIBODY (ROUTINE TESTING W REFLEX): HIV SCREEN 4TH GENERATION: NONREACTIVE

## 2017-03-10 LAB — STREP PNEUMONIAE URINARY ANTIGEN: STREP PNEUMO URINARY ANTIGEN: NEGATIVE

## 2017-03-10 MED ORDER — MORPHINE SULFATE (PF) 2 MG/ML IV SOLN
2.0000 mg | Freq: Once | INTRAVENOUS | Status: AC
  Administered 2017-03-10: 2 mg via INTRAVENOUS

## 2017-03-10 MED ORDER — PIPERACILLIN-TAZOBACTAM 3.375 G IVPB 30 MIN
3.3750 g | Freq: Once | INTRAVENOUS | Status: AC
  Administered 2017-03-10: 3.375 g via INTRAVENOUS
  Filled 2017-03-10: qty 50

## 2017-03-10 MED ORDER — IBUPROFEN 400 MG PO TABS
400.0000 mg | ORAL_TABLET | Freq: Four times a day (QID) | ORAL | Status: DC | PRN
  Administered 2017-03-10 – 2017-03-13 (×9): 400 mg via ORAL
  Filled 2017-03-10 (×4): qty 1
  Filled 2017-03-10 (×4): qty 2
  Filled 2017-03-10: qty 1

## 2017-03-10 MED ORDER — MORPHINE SULFATE (PF) 4 MG/ML IV SOLN
1.0000 mg | INTRAVENOUS | Status: DC | PRN
  Administered 2017-03-12: 1 mg via INTRAVENOUS
  Filled 2017-03-10: qty 1

## 2017-03-10 MED ORDER — VANCOMYCIN HCL IN DEXTROSE 1-5 GM/200ML-% IV SOLN
1000.0000 mg | Freq: Once | INTRAVENOUS | Status: AC
  Administered 2017-03-10: 1000 mg via INTRAVENOUS
  Filled 2017-03-10: qty 200

## 2017-03-10 MED ORDER — MORPHINE SULFATE (PF) 2 MG/ML IV SOLN
INTRAVENOUS | Status: AC
  Filled 2017-03-10: qty 1

## 2017-03-10 NOTE — Progress Notes (Signed)
Taught patient how to use I/S to help expand lungs. O2 sats at 84% on R/A. O2 applied 3L [place] . Sats up to 94%. Monitoring closely.

## 2017-03-10 NOTE — Progress Notes (Signed)
Subjective: The patient was lying in his bed went to the room today.  He noted when asked if he had difficulty breathing, noted to confirm right-sided chest pain.  The patient was placed on nonrebreather mask with high flow oxygen which mildly improved his tachypnea, and tachycardia.  Patient has been able to maintain his blood pressure with a systolic greater than 100 since admission.  He is aware that he is very ill but not yet critical and that the transfer to the ICU as a preemptive move in an attempt to get ahead of the illness before it worsens acutely.  At this time to be CCM team significant care of the patient will notify us when he is ready to return to our service.  We greatly appreciate their evaluation and care of your patient.  Objective:  Vital signs in last 24 hours: Vitals:   03/10/17 0000 03/10/17 0223 03/10/17 0347 03/10/17 0400  BP:   (!) 110/53   Pulse: (!) 108 (!) 111 94 (!) 108  Resp: (!) 30 (!) 27 (!) 26 (!) 43  Temp:   98 F (36.7 C)   TempSrc:   Oral   SpO2: (!) 88% 91% 93% 95%  Weight:      Height:       ROS negative except as per HPI.  Physical Exam  Constitutional: He appears well-developed and well-nourished.  Non-toxic appearance. He appears ill. He appears distressed.  Cardiovascular: Regular rhythm. Tachycardia present.  Pulmonary/Chest: Accessory muscle usage present. Tachypnea noted. No respiratory distress. He has decreased breath sounds in the right middle field and the right lower field. He has wheezes in the right middle field and the right lower field. He has rhonchi in the right lower field.  Abdominal: Soft. Bowel sounds are normal.  Vitals reviewed.  Assessment/Plan:  Active Problems:   Community acquired pneumonia   Cough with hemoptysis   Chest pain  Joshua Villegas is a pleasant 26 year old male presents with cough, hemoptysis, chest pain, dyspnea, fever, chills.  His presentation is most consistent with community acquired pneumonia as  per pulmonary embolism has become far less likely with CTA being negative for PE.  Community acquired pneumonia: Patient is afebrile today 3198 F, but remains tachycardic at 108, tachypneic at 43, blood pressure maintaining most recently recorded at 110/53.  As per overnight nurse patient desaturated to the 80s and required 3 L via nasal cannula of oxygen to maintain saturation above 95%. - Patient has been continued on ceftriaxone and azithromycin -Patient's urinary creatinine initially elevated 1.6 on admission down to 1.26 on repeat echo down to 1.11 as of this a.m.  We will continue mild IV hydration the patient is able to tolerate p.o. Intake -Blood cultures were drawn and are pending -Continuing fluid rehydration 100 mL's an hour Albuterol nebulizer 2.5 mg 3 mL as needed every 6 hours for wheezing or shortness of breath Significance is 50 for pain -Cough medicine codeine for cough -BMP be repeated in the a.m. - Continuous cardiac monitoring given the patient's chest pain and daily fullness - CBC this morning indicating improvement in his leukocytosis down from 16.1-13.3, with marked delusional effect likely as there is a decrease in his hemoglobin to 12.4 from 15.1 -CT angiogram negative for PE will treat as moderately severe community acquired pneumonia this time -Consult placed to be CCM due to patient's worsening respiratory status for closer monitoring evaluation in the ICU. - Patient started back on vancomycin -Urinary strep and Legionella performed- -  Respiratory viral panel ordered and pending -ABG ordered, if patient's pH arterial was 7.385, PCO2 36.2, PO2 80.9 on 4 L of nasal cannula.  Dispo: Anticipated discharge in approximately 2-4 day(s).   Lanelle BalHarbrecht, Lilia Letterman, MD 03/10/2017, 7:04 AM Pager: Pager# [phone]

## 2017-03-10 NOTE — Progress Notes (Signed)
  Date: 03/10/2017  Patient name: Joshua Villegas  Medical record number: [id number]  Date of birth: [birth year]   I have seen and evaluated this patient and I have discussed the plan of care with the house staff. Please see Dr. Godfrey PickHarbrecht's upcoming note for complete details.   We were called to bedside this morning during rounds to assess patient with hypoxia and tachypnea.  He was placed on ventimask with 15L of O2 and pulse ox improved to mid 90s.  He was breathing 50/minute and this improved with some morphine.  CXR showed stable bilateral pneumonia.  ABG on 15 L showed O2 of 80, which is lower than would be expected.  Given the tachypnea and hypoxia, we discussed with the ICU team and he will be transferred to the ICU for closer monitoring and evaluation for ventilatory support.  Further care per their team.  We will be available once patient is ready for transfer out of the unit.    Inez CatalinaMullen, Deionte Spivack B, MD 03/10/2017, 9:18 AM

## 2017-03-10 NOTE — Consult Note (Signed)
PULMONARY / CRITICAL CARE MEDICINE   Name: Joshua Villegas MRN: [id number] DOB: 1990/11/01    ADMISSION DATE:  03/09/2017 CONSULTATION DATE:  03/10/2017  REFERRING MD:  Dr. Estil DaftMullen IMTS  CHIEF COMPLAINT:  SOB  HISTORY OF PRESENT ILLNESS:   26 year old male with no significant past medical history [facility] with complaints of SOB, hemoptysis x 24 hours. Reportedly coughed up "a lot" of blood "half gallon". He is a non-smoker and social drinker 5-6 drinks per week. Upon arrival to the ED he was febrile and tachycardic complaining of pleuritic type chest pain. CXR demonstrated dense, lobar PNA. CTA was done to rule out PE, which was negative, but again showed lobar PNA. He was admitted to IMTS. Oxygen demands worsened requiring NRB to keeps sats in 90s. PCCM asked to see in consultation.   PAST MEDICAL HISTORY :  He  has no past medical history on file.  PAST SURGICAL HISTORY: He  has no past surgical history on file.  No Known Allergies  No current facility-administered medications on file prior to encounter.    Current Outpatient Medications on File Prior to Encounter  Medication Sig  . ibuprofen (ADVIL,MOTRIN) 200 MG tablet Take 400 mg by mouth every 6 (six) hours as needed for fever, headache or mild pain.    FAMILY HISTORY:  His has no family status information on file.    SOCIAL HISTORY: He  reports that  has never smoked. he has never used smokeless tobacco. He reports that he drinks alcohol. He reports that he does not use drugs.  REVIEW OF SYSTEMS:   Bolds are positive  Constitutional: weight loss, gain, night sweats, Fevers, chills, fatigue.  HEENT: headaches, Sore throat, sneezing, nasal congestion, post nasal drip, Difficulty swallowing, Tooth/dental problems, visual complaints visual changes, ear ache CV:  chest pain, radiates:,Orthopnea, PND, swelling in lower extremities, dizziness, palpitations, syncope.  GI  heartburn, indigestion, abdominal  pain, nausea, vomiting, diarrhea, change in bowel habits, loss of appetite, bloody stools.  Resp: cough, productive: , hemoptysis, dyspnea, chest pain, pleuritic.  Skin: rash or itching or icterus GU: dysuria, change in color of urine, urgency or frequency. flank pain, hematuria  MS: joint pain or swelling. decreased range of motion  Psych: change in mood or affect. depression or anxiety.  Neuro: difficulty with speech, weakness, numbness, ataxia    SUBJECTIVE:  "ive been better"  VITAL SIGNS: BP (!) 110/53 (BP Location: Right Arm)   Pulse (!) 108   Temp (!) 101.6 F (38.7 C) (Axillary)   Resp (!) 43   Ht 6\' 1"  (1.854 m)   Wt 108.4 kg (238 lb 14.4 oz)   SpO2 95%   BMI 31.52 kg/m   HEMODYNAMICS:    VENTILATOR SETTINGS: FiO2 (%):  [50 %] 50 %  INTAKE / OUTPUT: I/O last 3 completed shifts: In: 5517.1 [P.O.:690; I.V.:977.1; IV Piggyback:3850] Out: 300 [Urine:300]  PHYSICAL EXAMINATION: General:  Young adult male of normal body habitus in mild distress Neuro:  Alert, oriented, non-focal HEENT:  Ages/AT, PERRL, no JVD Cardiovascular:  Tachy, regular, no MRG Lungs:  Coarse bilateral breath sounds. Egophony with E to A transition bilateral posterior lung fields.  Abdomen:  Soft, non-tender, non-distended Musculoskeletal:  Soft, non-tender, non-distended Skin:  Grossly intact.   LABS:  BMET Recent Labs  Lab 03/09/17 1403 03/09/17 2028 03/10/17 0202  NA 136 140 138  K 4.2 4.4 3.6  CL 103 106 106  CO2 20* 23 23  BUN 21*  17 18  CREATININE 1.60* 1.26* 1.11  GLUCOSE 108* 100* 100*    Electrolytes Recent Labs  Lab 03/09/17 1403 03/09/17 2028 03/10/17 0202  CALCIUM 8.8* 8.0* 7.9*    CBC Recent Labs  Lab 03/09/17 1403 03/10/17 0202  WBC 16.1* 13.3*  HGB 15.1 12.4*  HCT 42.9 36.0*  PLT 245 173    Coag's No results for input(s): APTT, INR in the last 168 hours.  Sepsis Markers Recent Labs  Lab 03/09/17 1424 03/09/17 1645  LATICACIDVEN 4.87* 3.96*     ABG Recent Labs  Lab 03/10/17 0755  PHART 7.385  PCO2ART 36.2  PO2ART 80.9*    Liver Enzymes Recent Labs  Lab 03/09/17 1403  AST 31  ALT 20  ALKPHOS 58  BILITOT 1.8*  ALBUMIN 3.8    Cardiac Enzymes No results for input(s): TROPONINI, PROBNP in the last 168 hours.  Glucose No results for input(s): GLUCAP in the last 168 hours.  Imaging Dg Chest 2 View  Result Date: 03/09/2017 CLINICAL DATA:  Cough with blood-tinged sputum.  Chest pain.  Fever. EXAM: CHEST  2 VIEW COMPARISON:  None. FINDINGS: There is airspace consolidation throughout the anterior segment right upper lobe consistent with pneumonia. There is patchy opacity in the left base. Lungs elsewhere are clear. Heart size and pulmonary vascularity are normal. No adenopathy. No bone lesions. IMPRESSION: Airspace consolidation throughout the anterior segment right upper lobe without appreciable volume loss. This finding is consistent with pneumonia. There is rather subtle patchy opacity in the left base, likely a much smaller focus of pneumonia in this region. No adenopathy appreciable. Electronically Signed   By: Bretta BangWilliam  Woodruff III M.D.   On: 03/09/2017 14:47   Ct Angio Chest Pe W Or Wo Contrast  Result Date: 03/09/2017 CLINICAL DATA:  Right upper lobe pneumonia. PE suspected, intermediate probability, positive D-dimer. EXAM: CT ANGIOGRAPHY CHEST WITH CONTRAST TECHNIQUE: Multidetector CT imaging of the chest was performed using the standard protocol during bolus administration of intravenous contrast. Multiplanar CT image reconstructions and MIPs were obtained to evaluate the vascular anatomy. CONTRAST:  100mL ISOVUE-370 IOPAMIDOL (ISOVUE-370) INJECTION 76% COMPARISON:  None. FINDINGS: Cardiovascular:  The heart size is normal.  Aorta is unremarkable. Pulmonary artery opacification is satisfactory. There are no focal filling defects to suggest pulmonary embolus. Mediastinum/Nodes: Subcentimeter peritracheal lymph nodes are  present. No significant hilar or axillary adenopathy is present. Lungs/Pleura: Solid dated right upper lobe airspace disease is again noted. The patchy right lower lobe, left lower lobe, and lingular airspace disease is also confirmed. No other significant consolidation is present. There are no significant pericardial or pleural effusions. Upper Abdomen: Unremarkable Musculoskeletal: Bone windows are within normal limits. Review of the MIP images confirms the above findings. IMPRESSION: 1. No pulmonary embolus. 2. Consolidated right upper lobe pneumonia. 3. Patchy lingular and bilateral lower lobar pneumonia as well. Electronically Signed   By: Marin Robertshristopher  Mattern M.D.   On: 03/09/2017 18:17   Dg Chest Port 1 View  Result Date: 03/10/2017 CLINICAL DATA:  26 year old male with right upper lobe consolidation, right greater than left pneumonia. Cough and fever. EXAM: PORTABLE CHEST 1 VIEW COMPARISON:  CTA chest and chest radiographs 03/09/2017 FINDINGS: Portable AP upright view at 0748 hours. Continued confluent right upper lobe consolidation with air bronchograms. Stable lung volumes. Mediastinal contours remain normal. Patchy left mid to lower lung opacity persists. No pleural effusion identified. No pneumothorax or pulmonary edema. No areas of worsening ventilation. Visualized tracheal air column is within normal limits. Negative  visible bowel gas pattern. IMPRESSION: Radiographically stable bilateral pneumonia since yesterday, worst in the right upper lobe. Electronically Signed   By: Odessa Fleming M.D.   On: 03/10/2017 08:08    STUDIES:  CTA chest 12/9 > No pulmonary embolus. Consolidated right upper lobe pneumonia. Patchy lingular and bilateral lower lobar pneumonia as well.  CULTURES: Blood 12/9 > RVP 12/9 > Sputum 12/10 > Urine strep 12/10 > Urine legionella 12/10 >  ANTIBIOTICS: Zosyn 12/9 > Vancomycin 12/9 > Azithromycin 12/9 >  SIGNIFICANT EVENTS: 12/9 admit 12/10 worsening hypoxemia,  transfer to ICU  LINES/TUBES:   DISCUSSION: 26 year old male with dense lobar PNA and hypoxic respiratory failure requiring non-rebreather. Transferring to ICU based on hypoxia and likely need to ventilatory support.   ASSESSMENT / PLAN:  Acute hypoxemic respiratory failure - Supplemental O2 to keep SpO2 > 92% (currently 15L NRB) - May need ventilatory support, could try BiPAP  - Incentive spirometry and flutter valve - Transfer to ICU  Sepsis secondary to lobar community acquired pneumonia  Pleuritic chest pain - Continue zosyn, vancomycin, and azithromycin as above - Cultures pending, RVP pending - Droplet precautions - Consider adding tamiflu - Continue IVF maintenance  - Tylenol PRN for fever/pain - Low dose morphine PRN pain.  Hemoptysis - seems to have improved/resolved - Cough suppression - Monitor  AKI - improved - continue IVF - Follow BMP  Joneen Roach, AGACNP-BC [facility] Pulmonology/Critical Care Pager [phone] or [phone]  03/10/2017 8:32 AM

## 2017-03-10 NOTE — Progress Notes (Signed)
Patient transferred to 4N room 23 for respiratory support. Patient stated he would call his wife once he was settled in his room. All personal belongings sent with patient. Report given and patient stated he didn't have any questions. Swat team helped with transfer.

## 2017-03-10 NOTE — Progress Notes (Signed)
Patitent c /o SOB with pain on Rt. Chest while breathing after breathing treatment and voided. O2  Desaturated to middle 80's and applied  Venturi mask with FIO2 50%. Called FMTS and ordered chest x-ray stat. FMTS came to assess patient and Chest x-ray showed worst than yesterday. Consulted pulmo and pt needs to transfer to ICU. Patient received morphine 2mg  IVP. HS [employer]Lee RN

## 2017-03-11 DIAGNOSIS — R0600 Dyspnea, unspecified: Secondary | ICD-10-CM

## 2017-03-11 LAB — BASIC METABOLIC PANEL
ANION GAP: 6 (ref 5–15)
BUN: 11 mg/dL (ref 6–20)
CHLORIDE: 103 mmol/L (ref 101–111)
CO2: 26 mmol/L (ref 22–32)
Calcium: 8.2 mg/dL — ABNORMAL LOW (ref 8.9–10.3)
Creatinine, Ser: 0.96 mg/dL (ref 0.61–1.24)
Glucose, Bld: 88 mg/dL (ref 65–99)
POTASSIUM: 3.3 mmol/L — AB (ref 3.5–5.1)
SODIUM: 135 mmol/L (ref 135–145)

## 2017-03-11 MED ORDER — ALBUTEROL SULFATE (2.5 MG/3ML) 0.083% IN NEBU
2.5000 mg | INHALATION_SOLUTION | RESPIRATORY_TRACT | Status: DC | PRN
  Administered 2017-03-12: 2.5 mg via RESPIRATORY_TRACT
  Filled 2017-03-11: qty 3

## 2017-03-11 MED ORDER — POTASSIUM CHLORIDE 20 MEQ PO PACK
20.0000 meq | PACK | Freq: Two times a day (BID) | ORAL | Status: AC
  Administered 2017-03-11 (×2): 20 meq via ORAL
  Filled 2017-03-11 (×2): qty 1

## 2017-03-11 MED ORDER — ALBUTEROL SULFATE (2.5 MG/3ML) 0.083% IN NEBU
2.5000 mg | INHALATION_SOLUTION | Freq: Two times a day (BID) | RESPIRATORY_TRACT | Status: DC
  Administered 2017-03-11 – 2017-03-14 (×6): 2.5 mg via RESPIRATORY_TRACT
  Filled 2017-03-11 (×6): qty 3

## 2017-03-11 NOTE — Progress Notes (Signed)
Patient assisted OOB to chair. O2 at 2L via nasal cannula. Patient desat to 9687 while ambulating to bathroom on room air. O2 reapplied once in chair and patient saturations gradually up to 95% within 10 minutes. Patient tachypnic while conversing with family in room but when resting RR WNL.

## 2017-03-11 NOTE — Progress Notes (Signed)
[facility], patient to be transferred to 86M01, non-tele.

## 2017-03-11 NOTE — Plan of Care (Signed)
Pt verbalizes agreement to get OOB today to improve respiratory status and agrees to use IS more frequently today.

## 2017-03-11 NOTE — Progress Notes (Signed)
Transfer Note: Pt transferring from ICU to 5M01  Traveling Method: W/C Transferring Unit: 4N Mental Orientation: A&O x4 Telemetry: Not ordered Assessment: Completed Skin: Warm, dry and intact. IV: #20 LAC; #20 LH; both flushed and saline locked Pain: Stated Tubes: O2 @ 2L Safety Measures: Safety Fall Prevention Plan has been given, discussed and signed Admission: Completed [facility] Orientation: Patient has been orientated to the room, unit and staff.  Family: Wife at beside assisting with care Transferring Incident::   Orders have been reviewed and implemented. Will continue to monitor the patient. Call light has been placed within reach and bed alarm has been activated.   Fara BorosSara Senna Lape BSN, RN [facility]

## 2017-03-11 NOTE — Progress Notes (Signed)
O2 decreased to 3L via [place], pt saturations 98% and RR WNL. Patient achieved 600 on IS. Patient instructed on importance of IS use 10 times per hour while awake. Patient and wife verbalize understanding. Will continue to monitor.

## 2017-03-11 NOTE — Progress Notes (Signed)
PULMONARY / CRITICAL CARE MEDICINE   Name: Joshua Villegas MRN: [id number] DOB: 1990-12-19    ADMISSION DATE:  03/09/2017 CONSULTATION DATE:  12/10   CHIEF COMPLAINT:  Dyspnea  HISTORY OF PRESENT ILLNESS:       26 year old with essentially no significant past medical history who presented with fevers chills and cough.  He had significant infiltrates on chest x-ray and a CTA that was negative for PE.  He was initially treated with vancomycin and Zosyn.  His clinical status declined yesterday and he was requiring high flow oxygen and was therefore transferred to the intensive care unit.  Overnight his oxygen requirements have been tapered down to a nasal cannula.  He still has a cough productive of blood-tinged sputum but says that his breathing is improved.  He still having some chest discomfort with cough.  PAST MEDICAL HISTORY :  He  has no past medical history on file.  PAST SURGICAL HISTORY: He  has no past surgical history on file.  No Known Allergies  No current facility-administered medications on file prior to encounter.    Current Outpatient Medications on File Prior to Encounter  Medication Sig  . ibuprofen (ADVIL,MOTRIN) 200 MG tablet Take 400 mg by mouth every 6 (six) hours as needed for fever, headache or mild pain.    FAMILY HISTORY:  His has no family status information on file.    SOCIAL HISTORY: He  reports that  has never smoked. he has never used smokeless tobacco. He reports that he drinks alcohol. He reports that he does not use drugs.  REVIEW OF SYSTEMS:   He denies any prior history of lung disease or unusual shortness of breath.  SUBJECTIVE:  As above  VITAL SIGNS: BP 121/75   Pulse 72   Temp 98 F (36.7 C) (Oral)   Resp (!) 26   Ht 6\' 1"  (1.854 m)   Wt 240 lb 11.9 oz (109.2 kg)   SpO2 99%   BMI 31.76 kg/m   HEMODYNAMICS:    VENTILATOR SETTINGS: FiO2 (%):  [40 %-100 %] 40 %  INTAKE / OUTPUT: I/O last 3 completed shifts: In: 4410  [P.O.:1410; I.V.:2900; IV Piggyback:100] Out: 1250 [Urine:1250]  PHYSICAL EXAMINATION: General: He is sitting up in bed entirely appropriate and conversing in full sentences in no overt distress breathing on a nasal cannula. Cardiovascular: S1 and S2 are regular without murmur rub or gallop.  The PMI is not impressively hyperdynamic. Lungs: Respirations are unlabored, there is substantially decreased air movement throughout the right hemithorax.  There is no wheezing. Abdomen: The abdomen is soft without any organomegaly masses tenderness guarding or rebound  BMET Recent Labs  Lab 03/09/17 2028 03/10/17 0202 03/11/17 0308  NA 140 138 135  K 4.4 3.6 3.3*  CL 106 106 103  CO2 23 23 26   BUN 17 18 11   CREATININE 1.26* 1.11 0.96  GLUCOSE 100* 100* 88    Electrolytes Recent Labs  Lab 03/09/17 2028 03/10/17 0202 03/11/17 0308  CALCIUM 8.0* 7.9* 8.2*    CBC Recent Labs  Lab 03/09/17 1403 03/10/17 0202  WBC 16.1* 13.3*  HGB 15.1 12.4*  HCT 42.9 36.0*  PLT 245 173    Coag's No results for input(s): APTT, INR in the last 168 hours.  Sepsis Markers Recent Labs  Lab 03/09/17 1424 03/09/17 1645  LATICACIDVEN 4.87* 3.96*    ABG Recent Labs  Lab 03/10/17 0755  PHART 7.385  PCO2ART 36.2  PO2ART 80.9*    Liver Enzymes  Recent Labs  Lab 03/09/17 1403  AST 31  ALT 20  ALKPHOS 58  BILITOT 1.8*  ALBUMIN 3.8    Cardiac Enzymes No results for input(s): TROPONINI, PROBNP in the last 168 hours.  Glucose No results for input(s): GLUCAP in the last 168 hours.  Imaging No results found.     ANTIBIOTICS: Azithromycin and Zosyn  DISCUSSION: This is a 26 year old with no significant past medical history who presents with community-acquired pneumonia.  He was transferred to the ICU due to high oxygen requirements but has been tapered down to nasal cannula this morning and he is breathing comfortably.  I am going to continue him on a combination of azithromycin  and Zosyn and transfer him back to the medical service today.  We have no positive cultures.  Penny PiaWJ Gray, MD Pulmonary and Critical Care Medicine [facility] Pager: [phone]) [phone]  03/11/2017, 9:09 AM

## 2017-03-11 NOTE — Progress Notes (Signed)
eLink Physician-Brief Progress Note Patient Name: Joshua Villegas DOB: May 29, 1990 MRN: [id number]   Date of Service  03/11/2017  HPI/Events of Note  Transferred to Med-Surgical bed. Orders for Telemetry. No Hx of cardiac disease.   eICU Interventions  Will D/C cardiac monitoring.     Intervention Category Major Interventions: Other:  Lenell AntuSommer,Kem Parcher Eugene 03/11/2017, 9:08 PM

## 2017-03-11 NOTE — Care Management Note (Signed)
Case Management Note  Patient Details  Name: Nunzio CoryJack Yamaguchi MRN: [id number] Date of Birth: 03/16/1991  Subjective/Objective:  Pt admitted on 03/09/17 with fever, chills and cough; found to have PNA on CXR.  PTA, pt independent, lives with wife and young children.                    Action/Plan: Pt improving; now on 3L/[place].  Will follow for discharge needs as pt progresses.   Expected Discharge Date:  03/14/17               Expected Discharge Plan:  Home/Self Care  In-House Referral:     Discharge planning Services  CM Consult  Post Acute Care Choice:    Choice offered to:     DME Arranged:    DME Agency:     HH Arranged:    HH Agency:     Status of Service:  In process, will continue to follow  If discussed at Long Length of Stay Meetings, dates discussed:    Additional Comments:  Quintella BatonJulie W. Rage Beever, RN, BSN  Trauma/Neuro ICU Case Manager [phone]

## 2017-03-12 LAB — BASIC METABOLIC PANEL
Anion gap: 7 (ref 5–15)
BUN: 11 mg/dL (ref 6–20)
CHLORIDE: 104 mmol/L (ref 101–111)
CO2: 27 mmol/L (ref 22–32)
Calcium: 8.5 mg/dL — ABNORMAL LOW (ref 8.9–10.3)
Creatinine, Ser: 0.87 mg/dL (ref 0.61–1.24)
GFR calc Af Amer: >60 mL/min (ref >60)
GFR calc non Af Amer: >60 mL/min (ref >60)
GLUCOSE: 98 mg/dL (ref 65–99)
POTASSIUM: 3.7 mmol/L (ref 3.5–5.1)
Sodium: 138 mmol/L (ref 135–145)

## 2017-03-12 LAB — LEGIONELLA PNEUMOPHILA SEROGP 1 UR AG: L. pneumophila Serogp 1 Ur Ag: NEGATIVE

## 2017-03-12 LAB — CBC WITH DIFFERENTIAL/PLATELET
Basophils Absolute: 0 10*3/uL (ref 0.0–0.1)
Basophils Relative: 0 %
EOS PCT: 4 %
Eosinophils Absolute: 0.4 10*3/uL (ref 0.0–0.7)
HCT: 35.7 % — ABNORMAL LOW (ref 39.0–52.0)
Hemoglobin: 12 g/dL — ABNORMAL LOW (ref 13.0–17.0)
LYMPHS ABS: 1.2 10*3/uL (ref 0.7–4.0)
LYMPHS PCT: 13 %
MCH: 27.7 pg (ref 26.0–34.0)
MCHC: 33.6 g/dL (ref 30.0–36.0)
MCV: 82.4 fL (ref 78.0–100.0)
MONO ABS: 0.8 10*3/uL (ref 0.1–1.0)
Monocytes Relative: 9 %
Neutro Abs: 6.6 10*3/uL (ref 1.7–7.7)
Neutrophils Relative %: 74 %
PLATELETS: 224 10*3/uL (ref 150–400)
RBC: 4.33 MIL/uL (ref 4.22–5.81)
RDW: 14.1 % (ref 11.5–15.5)
WBC: 8.9 10*3/uL (ref 4.0–10.5)

## 2017-03-12 MED ORDER — OXYCODONE-ACETAMINOPHEN 5-325 MG PO TABS
1.0000 | ORAL_TABLET | ORAL | Status: DC | PRN
  Administered 2017-03-12 – 2017-03-13 (×5): 1 via ORAL
  Filled 2017-03-12 (×5): qty 1

## 2017-03-12 MED ORDER — ENOXAPARIN SODIUM 40 MG/0.4ML ~~LOC~~ SOLN
40.0000 mg | SUBCUTANEOUS | Status: DC
  Administered 2017-03-12 – 2017-03-13 (×2): 40 mg via SUBCUTANEOUS
  Filled 2017-03-12 (×2): qty 0.4

## 2017-03-12 NOTE — Progress Notes (Signed)
   Subjective:   Brief Transfer Summary: This is a 26 yo previously healthy patient who presented on 12/9 with 3 days of chest pain, productive cough, hemoptysis, fatigue and anorexia. Cough started on Friday which progressed to hemoptysis. CXR in the ER showed RUL consolidation and CTA showed multilobar pneumonia. In the following morning during rounds, he was hypoxic and tachypneic and so was placed on ventimask with 15L oxygen, and ABG on 15L showed O2 of 80 and so he was transferred to the ICU. For closer monitoring.  His oxygen requirements have been tapered down to nasal cannula but still has some productive cough with tinged sputum. He has been continued on azithromycin and zosyn was stopped. Cultures have been negative so far.  Respiratory viral panel showed rhinovirus/enterovirus   This morning, patient said he felt very well. Wife was at bedside. Patient had just taken shower for 30 minutes without oxygen and said he was able to do that. Wife said that the patient's daughter had strep throat, and the patient's dad had some cold prior to the pt being admitted.   He denies any dyspnea. Still some productive cough with some blood tinged sputum but it has decreased.  Pt says he works in the [place] [employer] and is eager to be discharged, still wearing 2 L oxygen   Objective:  Vital signs in last 24 hours: Vitals:   03/12/17 0348 03/12/17 0647 03/12/17 0837 03/12/17 1006  BP:  134/72 124/68   Pulse:  73 79 78  Resp:  19 18 18   Temp:  98.2 F (36.8 C) 97.8 F (36.6 C)   TempSrc:  Oral Oral   SpO2: 97% 98% 98% 94%  Weight:      Height:       General: Vital signs reviewed. Patient in no acute distress, resting comfortably on 2 L oxygen, speaking in complete sentences   Cardiovascular: regular rate, rhythm, no murmur appreciated  Pulmonary/Chest: Clear to auscultation bilaterally, no wheezes, rales, or rhonchi. There is some decreased air movement on the right lower lung  posteriorly Abdominal: Soft, non-tender, non-distended, BS + Extremities: No lower extremity edema bilaterally, pulses symmetric and intact bilaterally. Skin: Warm, dry and intact. No rashes or erythema.    Assessment/Plan:  Active Problems:   Community acquired pneumonia   Cough with hemoptysis   Chest pain   Sepsis due to pneumonia [facility](HCC)  Community acquired pneumonia, likely viral or atypical: Patient's oxygen requirements have been decreased to 2 L nasal cannula. Blood cultures have been negative so far. Respiratory viral panel was positive for rhinovirus/enterovirus, so this may have led to a pneumonia. Strep pneumo urinary antigen was negative, and legionella is pending. Patient is clinically better and white count has gone down to 8.9 and normal electrolytes. He has been afebrile over the past 24 hours.   -continue azithromycin for total 5 day course for cap  -albuterol PRN -tessalon PRN for cough  -ibuprofen for pain Morphine 1-2 mg q4 hours PRN for pain -wean oxygen as tolerated   Dispo: Anticipated discharge in approximately 1 day(s).   Deneise LeverSaraiya, Karee Christopherson, MD 03/12/2017, 11:22 AM

## 2017-03-12 NOTE — Discharge Summary (Signed)
Name: Joshua Villegas MRN: [id number] DOB: 10/31/1990 26 y.o. PCP: Patient, No Pcp Per  Date of Admission: 03/09/2017  1:52 PM Date of Discharge: 03/15/2017 Attending Physician: Debe CoderMullen, Emily, MD  Discharge Diagnosis: Active Problems:   Community acquired pneumonia   Cough with hemoptysis   Chest pain   Sepsis due to pneumonia [facility](HCC)   Discharge Medications: Allergies as of 03/14/2017   No Known Allergies     Medication List    TAKE these medications   albuterol 108 (90 Base) MCG/ACT inhaler Commonly known as:  PROVENTIL HFA;VENTOLIN HFA Inhale 2 puffs into the lungs every 6 (six) hours as needed for wheezing or shortness of breath.   benzonatate 100 MG capsule Commonly known as:  TESSALON Take 1 capsule (100 mg total) by mouth 3 (three) times daily as needed for cough.   Dextromethorphan HBr 10 MG/15ML Syrp Take 15 mLs (10 mg total) by mouth every 6 (six) hours as needed.   ibuprofen 800 MG tablet Commonly known as:  ADVIL,MOTRIN Take 1 tablet (800 mg total) by mouth every 8 (eight) hours as needed for fever, headache or mild pain. What changed:    medication strength  how much to take  when to take this       Disposition and follow-up:   Joshua Villegas was [facility] in Stable condition.  At the hospital follow up visit please address:  1.  Please assess the patients recovery from his community acquired pneumonia.    2.  Labs / imaging needed at time of follow-up: Repeat Chest X-ray in 4-6 weeks from discharge for radiographic resolution given age and no clear identifiable risk factors.  3.  Pending labs/ test needing follow-up: n/a  Follow-up Appointments:   Hospital Course by problem list: Active Problems:   Community acquired pneumonia   Cough with hemoptysis   Chest pain   Sepsis due to pneumonia (HCC)   1. Severe Community acquired pneumonia: Joshua Villegas is a pleasant [age] year old male who presented with  hemoptysis, cough, fever, chills, nausea, episode of vomiting, and diffuse generalized weakness times 1 day.  He has an insignificant past medical history and is not on prescription medication.  Patient presented with hypoxia, tachypnea which was initially treated successfully with 2Liters of oxygen via nasal cannula.  Initial Chest x-ray indicated probable pneumonia of the right upper lobe, anterior segment.  CTA was performed due to concerns for pulmonary embolism given the positive d-dimer and symptoms.  CTA failed to demonstrate evidence consistent with a pulmonary embolus, but did confirm the report of pneumonia. No clear mass or structural abnormality was found which could explain the pneumonia at that time.  Overnight on the first night of his admission the patient worsened significantly with worsening hypoxia, requiring increased oxygen, experience worsening tachypnea, increased tachycardia, and mild confusion.  Patient was admitted to the ICU with the critical care team in agreement for the following 2 days where he improved dramatically.  IV antibiotics were discontinued there and he was placed on azithromycin to complete his IV antibiotic course.  No clear etiology was identified despite completion of respiratory panel and urinary antigens for Legionella and strep pneumo.  Patient has no clear etiology for immunocompromise state, routine HIV screen was negative, he has not been incarcerated, no recent sick contacts other than his daughter who had strep throat, or other concerning causes consistent with immunocompromise state.  Patient regularly travels to [place] for his job but denies known sick  contacts. He attested to significant decreased sleep for the past 2 months due to work and has not been taking "good" care of himself in that regard as per wife.  It is the recommendation of the pulmonary and the IMTS team that he undergo repeat chest xray in 4-6 weeks from discharge for evaluation of  radiographic imaging resolution due to concern for secondary etiology leading to the pneumonia.   The patient had an elevated lactic acid on admission to 4.87, fever, source of infection, tachycardia and tachypnea. He was treated with rapid IV fluid rehydration due to concern for sepsis. He recovered quickly with IV hydration and IV antibiotics including Vancomycin, Zosyn, azithromycin.  Discharge Vitals:   BP (!) 144/70 (BP Location: Right Arm)   Pulse 80   Temp 97.7 F (36.5 C) (Oral)   Resp 18   Ht 6\' 1"  (1.854 m)   Wt 239 lb 13.1 oz (108.8 kg)   SpO2 93%   BMI 31.64 kg/m   Pertinent Labs, Studies, and Procedures:  CBC Latest Ref Rng & Units 03/12/2017 03/10/2017 03/09/2017  WBC 4.0 - 10.5 K/uL 8.9 13.3(H) 16.1(H)  Hemoglobin 13.0 - 17.0 g/dL 12.0(L) 12.4(L) 15.1  Hematocrit 39.0 - 52.0 % 35.7(L) 36.0(L) 42.9  Platelets 150 - 400 K/uL 224 173 245   CMP Latest Ref Rng & Units 03/12/2017 03/11/2017 03/10/2017  Glucose 65 - 99 mg/dL 98 88 409(W100(H)  BUN 6 - 20 mg/dL 11 11 18   Creatinine 0.61 - 1.24 mg/dL 1.190.87 1.470.96 8.291.11  Sodium 135 - 145 mmol/L 138 135 138  Potassium 3.5 - 5.1 mmol/L 3.7 3.3(L) 3.6  Chloride 101 - 111 mmol/L 104 103 106  CO2 22 - 32 mmol/L 27 26 23   Calcium 8.9 - 10.3 mg/dL 5.6(O8.5(L) 1.3(Y8.2(L) 7.9(L)  Total Protein 6.5 - 8.1 g/dL - - -  Total Bilirubin 0.3 - 1.2 mg/dL - - -  Alkaline Phos 38 - 126 U/L - - -  AST 15 - 41 U/L - - -  ALT 17 - 63 U/L - - -   CTA Chest: IMPRESSION: 1. No pulmonary embolus. 2. Consolidated right upper lobe pneumonia. 3. Patchy lingular and bilateral lower lobar pneumonia as well.  Respiratory Panel: Adenovirus NOT DETECTED NOT DETECTED   Coronavirus 229E NOT DETECTED NOT DETECTED   Coronavirus HKU1 NOT DETECTED NOT DETECTED   Coronavirus NL63 NOT DETECTED NOT DETECTED   Coronavirus OC43 NOT DETECTED NOT DETECTED   Metapneumovirus NOT DETECTED NOT DETECTED   Rhinovirus / Enterovirus NOT DETECTED DETECTED Abnormal    Influenza A NOT  DETECTED NOT DETECTED   Influenza B NOT DETECTED NOT DETECTED   Parainfluenza Virus 1 NOT DETECTED NOT DETECTED   Parainfluenza Virus 2 NOT DETECTED NOT DETECTED   Parainfluenza Virus 3 NOT DETECTED NOT DETECTED   Parainfluenza Virus 4 NOT DETECTED NOT DETECTED   Respiratory Syncytial Virus NOT DETECTED NOT DETECTED   Bordetella pertussis NOT DETECTED NOT DETECTED   Chlamydophila pneumoniae NOT DETECTED NOT DETECTED   Mycoplasma pneumoniae NOT DETECTED NOT DETECTED   Resulting Agency  [facility]    Discharge Instructions: Discharge Instructions    Diet - low sodium heart healthy   Complete by:  As directed    Increase activity slowly   Complete by:  As directed       Signed: Lanelle BalHarbrecht, Bettie Capistran, MD 03/15/2017, 7:31 AM   Pager: Pager# [phone]

## 2017-03-13 DIAGNOSIS — J1289 Other viral pneumonia: Secondary | ICD-10-CM

## 2017-03-13 MED ORDER — TRAMADOL HCL 50 MG PO TABS
50.0000 mg | ORAL_TABLET | Freq: Four times a day (QID) | ORAL | Status: DC | PRN
  Administered 2017-03-13 – 2017-03-14 (×4): 50 mg via ORAL
  Filled 2017-03-13 (×4): qty 1

## 2017-03-13 MED ORDER — TRAMADOL HCL 50 MG PO TABS: 50.0000 mg | ORAL_TABLET | Freq: Four times a day (QID) | ORAL | Status: DC

## 2017-03-13 MED ORDER — AZITHROMYCIN 500 MG PO TABS
250.0000 mg | ORAL_TABLET | Freq: Once | ORAL | Status: AC
  Administered 2017-03-14: 250 mg via ORAL
  Filled 2017-03-13: qty 1

## 2017-03-13 MED ORDER — IBUPROFEN 400 MG PO TABS
800.0000 mg | ORAL_TABLET | Freq: Four times a day (QID) | ORAL | Status: DC | PRN
  Administered 2017-03-13 – 2017-03-14 (×4): 800 mg via ORAL
  Filled 2017-03-13 (×4): qty 2

## 2017-03-13 NOTE — Progress Notes (Signed)
Patient ambulated to the hallway,resting room air saturation was 88%,his room air saturation while ambulating was between the range of 90-92%,at times was  dropping down to 86% but quickly recovered to 90%,maintaining 90% most of the time.Not complaining of lightheaded nor chest discomfort.

## 2017-03-13 NOTE — Progress Notes (Signed)
SATURATION QUALIFICATIONS: (This note is used to comply with regulatory documentation for home oxygen)  Patient Saturations on Room Air at Rest =  88%  Patient Saturations on Room Air while Ambulating = 90%  Patient Saturations on N/A Liters of oxygen while Ambulating =  N/A  Please briefly explain why patient needs home oxygen:

## 2017-03-13 NOTE — Progress Notes (Signed)
   Subjective: Patient is resting in his bed upon entering the room today.  States he felt much better, although, he continues to experience occasional hemoptysis.  Hemoptysis has improved tremendously since admission but this remained present.  Patient denied chest pain, headache, nausea, vomiting, diarrhea, constipation, fever, chills, or muscle aches.  Patient requested discharge home but it was to be discharged until he is medically stable.  Patient was given information regarding the need to follow-up as an outpatient in 1 week and again in 4-6 weeks for repeat chest x-ray to observe for recovery.  If at that time he was advised he may need additional investigation if radiographic resolution does not occurred.  Objective:  Vital signs in last 24 hours: Vitals:   03/12/17 1704 03/12/17 2129 03/12/17 2246 03/13/17 0513  BP: (!) 143/78  (!) 162/79 (!) 146/79  Pulse: 75 83 91 74  Resp: 18 20 18 18   Temp: 98 F (36.7 C)  98.6 F (37 C) 98.3 F (36.8 C)  TempSrc: Oral  Oral Oral  SpO2: 96% 96% 94% 95%  Weight:      Height:       ROS negative except as per HPI.  Physical Exam  Constitutional: He appears well-developed and well-nourished.  Cardiovascular: Normal rate and regular rhythm.  Vitals reviewed.  Assessment/Plan:  Active Problems:   Community acquired pneumonia   Cough with hemoptysis   Chest pain   Sepsis due to pneumonia (HCC)  Community-acquired pneumonia, possibly viral or atypical:  Patient's oxygen requirements decreased to 2 L of nasal cannula.  He is able to shower without oxygen approximate 30 minutes stating that he felt that he was able to do this without extreme difficulty.  Patient been afebrile for greater than 24 hours, strep pneumo urinary antigen negative, Legionella negative, viral panel positive only for rhinovirus. May be amenable to discharge home today if he continues to improve. Currently still experiencing hemoptysis and dyspnea with exertion.    -Continue azithromycin for total of 5 days for -Continue albuterol as needed -Tessalon as needed for cough -Ibuprofen for pain -off oxygen now, order placed to ambulate with pulse ox to determine oxygen need -Patient will need close follow-up in the outpatient with a 1 week follow-up visit and a 4-week repeat chest x-ray to ensure resolution of his symptoms given his age and lack of clear causative effect   Dispo: Anticipated discharge in approximately 1-2 day(s).   Lanelle BalHarbrecht, Aminata Buffalo, MD 03/13/2017, 7:14 AM Pager: Pager# [phone]

## 2017-03-14 ENCOUNTER — Telehealth: Payer: Self-pay

## 2017-03-14 LAB — CULTURE, BLOOD (ROUTINE X 2)
CULTURE: NO GROWTH
CULTURE: NO GROWTH
SPECIAL REQUESTS: ADEQUATE

## 2017-03-14 MED ORDER — IBUPROFEN 800 MG PO TABS: 800.0000 mg | ORAL_TABLET | Freq: Three times a day (TID) | ORAL | 0 refills | Status: DC | PRN

## 2017-03-14 MED ORDER — ALBUTEROL SULFATE HFA 108 (90 BASE) MCG/ACT IN AERS: 2.0000 | INHALATION_SPRAY | Freq: Four times a day (QID) | RESPIRATORY_TRACT | 2 refills | Status: AC | PRN

## 2017-03-14 MED ORDER — BENZONATATE 100 MG PO CAPS: 100.0000 mg | ORAL_CAPSULE | Freq: Three times a day (TID) | ORAL | 0 refills | Status: DC | PRN

## 2017-03-14 MED ORDER — DEXTROMETHORPHAN HBR 10 MG/15ML PO SYRP: 10.0000 mg | ORAL_SOLUTION | Freq: Four times a day (QID) | ORAL | 2 refills | Status: DC | PRN

## 2017-03-14 NOTE — Progress Notes (Signed)
Pt. discharged to home. Pt after visit summary reviewed and pt capable of re verbalizing medications and follow up appointments. Pt remains stable. No signs and symptoms of distress. Educated to return to ER in the event of SOB, dizziness, chest pain, or fainting. Charnise Lovan, RN  

## 2017-03-14 NOTE — Telephone Encounter (Signed)
Hospital TOC per Dr Crista ElliotHarbrecht, discharge 03/14/2017, appt 03/21/2017 @ 10:15.

## 2017-03-14 NOTE — Progress Notes (Signed)
   Subjective: The patient was resting in his bed today anxious to go home. He denied complaints stating that he was felt much better, his coughing had improved and his shortness of breath was somewhat better. He felt good to go home and denied fever. He was given the standard return if fever, chills, worsening cough, hemoptysis or other concerning symptoms occurs information.   Objective:  Vital signs in last 24 hours: Vitals:   03/13/17 2029 03/14/17 0459 03/14/17 0819 03/14/17 0909  BP: (!) 155/80 137/70  (!) 144/70  Pulse: 90 71 71 80  Resp: 17 18 18 18   Temp: 98.4 F (36.9 C) 97.7 F (36.5 C)  97.7 F (36.5 C)  TempSrc: Oral Oral  Oral  SpO2: 93% 92% 92% 93%  Weight: 239 lb 13.1 oz (108.8 kg)     Height:       ROS negative except as per HPI.  Physical Exam  Constitutional: He appears well-developed and well-nourished. He does not appear ill.  Cardiovascular: Normal rate and regular rhythm. Exam reveals gallop.  Murmur heard. Pulmonary/Chest: Effort normal. He has decreased breath sounds in the right upper field and the right middle field. He has wheezes in the right upper field and the right middle field.  Abdominal: Soft. Bowel sounds are normal. There is no tenderness.  Musculoskeletal:       Right lower leg: He exhibits no tenderness and no edema.       Left lower leg: He exhibits no tenderness and no edema.   Assessment/Plan:  Active Problems:   Community acquired pneumonia   Cough with hemoptysis   Chest pain   Sepsis due to pneumonia (HCC)  Community-acquired pneumonia, possibly viral or atypical:  Patient's oxygen requirements decreased to 2 L of nasal cannula prior to being weened off. He has been placed on O2 intermittantly for the past 48 hours as he became dyspneic with mild exertion.  He was able to shower again without oxygen. Significant improvement in his dyspnea. -Continue azithromycin for total of 5 days has been completed -Continue albuterol as  needed -Tessalon as needed for cough -Ibuprofen for pain -Patient will need close follow-up in the outpatient with a 1 week follow-up visit and a 4-week repeat chest x-ray to ensure resolution of his symptoms given his age and lack of clear causative effect  Dispo: Anticipated discharge in approximately 1-2 day(s).   Lanelle BalHarbrecht, Sinia Antosh, MD 03/14/2017, 12:13 PM Pager: Pager# [phone]

## 2017-03-14 NOTE — Discharge Instructions (Signed)
FOLLOW-UP INSTRUCTIONS When: One week, 03/21/2017 For: Hospital follow-up What to bring: questions and medications.  IT IS ESSENTIAL THAT YOU FOLLOW-UP AT A CLINIC OR WITH A PCP FOR A CHEST X-RAY IN 4-6 WEEKS.   We can be reached at [phone], ask for Dr. Crista ElliotHarbrecht and they can forward the message.  Please note that the advise given below is not all encompassing but is given to better direct the healing process.  I would recommend minimal activity until 12/17. From there you may slowly advance as tolerated but avoiding strenuous activity such as running, heavy lifting as these could result in a rapid decline in you blood oxygen content and potentially lead to severe shortness of breath and or syncope.   In addition we would advise avoiding flights as the altitude can have an impact on your pulmonary function. Avoiding long periods of sitting is also advised against as this could lead to further complications.   At this time walking short distances as tolerated and engaging in mentally stimulating activities that are minimally stressful is advisable. Adequate sleep is essential to ensure proper and timely healing and return to baseline.   If  You have any questions at any time please give the number above a call and they will forward that message to a member of our team.

## 2017-03-14 NOTE — Telephone Encounter (Signed)
Pt is currently still inpt

## 2017-03-15 ENCOUNTER — Telehealth: Payer: Self-pay | Admitting: Internal Medicine

## 2017-03-15 NOTE — Telephone Encounter (Signed)
  Reason for call:   I returned a call to [name]'s wife Joshua Villegas/Joshua Villegas at 7:45pm regarding his pain. Wife notes Joshua Villegas continues to have pain at site of his 'pneumonia' which is not really relieved by ibuprofen. Pain hasn't worsened or changed in character since discharge. Wants to know if I can prescribe another pain medication or antibiotic.  She's been taking his temp and notes he's been afebrile and feeling OK. States she has a PCP appointment next week.    Assessment/ Plan:   Reassured Joshua Villegas that it will take time for his body to heal. Explained that his symptoms havent worsened and he is afebrile and currently does not need more antibiotics. Advised to call clinic or PCP if patient febrile (temp >100.5*) and to move up their PCP appointment if they continue to worry.   As always, pt is advised that if symptoms worsen or new symptoms arise, they should go to an urgent care facility or to to ER for further evaluation.   Elmin Wiederholt, DO   03/15/2017, 7:45 PM

## 2017-03-19 NOTE — Telephone Encounter (Signed)
Called pt - no answer; left message to give us a call back, explained this is HFU call (transition of care). Per Epic, pt canceled Friday's app[facility] at [facility] and has another appt scheduled.

## 2017-03-20 NOTE — Telephone Encounter (Signed)
Called pt, unable to leave message.

## 2017-03-21 ENCOUNTER — Ambulatory Visit: Payer: 59

## 2017-03-21 ENCOUNTER — Encounter: Payer: Self-pay | Admitting: Family Medicine

## 2017-03-21 ENCOUNTER — Ambulatory Visit (INDEPENDENT_AMBULATORY_CARE_PROVIDER_SITE_OTHER): Payer: 59 | Admitting: Family Medicine

## 2017-03-21 DIAGNOSIS — J181 Lobar pneumonia, unspecified organism: Secondary | ICD-10-CM

## 2017-03-21 DIAGNOSIS — Z23 Encounter for immunization: Secondary | ICD-10-CM | POA: Diagnosis not present

## 2017-03-21 DIAGNOSIS — J189 Pneumonia, unspecified organism: Secondary | ICD-10-CM

## 2017-03-21 MED ORDER — DICLOFENAC SODIUM 75 MG PO TBEC: 75.0000 mg | DELAYED_RELEASE_TABLET | Freq: Two times a day (BID) | ORAL | 0 refills | Status: DC

## 2017-03-21 NOTE — Assessment & Plan Note (Signed)
Seems to be improving today however is still having significant symptoms of shortness of breath and cough.  Advised patient that this could take several weeks to months to fully recover.  His pulmonary exam today is stable.  Advised patient to continue using incentive spirometer at home.  Also advised him to use albuterol as needed.  I sent in a prescription for diclofenac to help him with his rib pain.  He will follow-up in 4-6 weeks.  He will need repeat chest x-ray at that time to monitor for resolution of his pneumonia.

## 2017-03-21 NOTE — Patient Instructions (Signed)
Start the diclofenac.   Continue the incentive spirometer.  Come back in 4-6 weeks.  Take care, Dr Jimmey RalphParker

## 2017-03-21 NOTE — Progress Notes (Signed)
Subjective:  Joshua Villegas is a 26 y.o. male who presents today with a chief complaint of pneumonia follow-up and to establish care.   HPI:  Summary of recent hospitalization: Patient presented to the ED on 03/05/2017 with 1 day history of cough, fever, chills, nausea, vomiting, and hemoptysis.  Chest x-ray was concerning for pneumonia and patient was started on empiric antibiotics and admitted.  During the first night of his admission, patient had significant worsening of his symptoms with hypoxia and tachypnea and was subsequently transferred to the ICU where he stayed for approximately 36 hours.  Further workup did not reveal any clear etiology for patient's pneumonia-she did have a respiratory viral panel that was notably positive for rhinovirus.  Shortness of breath, new issue Started about 2 weeks ago.  As noted above, patient was diagnosed with community acquired pneumonia approximately 2 weeks ago.  He was discharged from the hospital about a week ago and has continued to improve since then.  He still has a moderate amount of shortness of breath.  Occasionally has a small amount of pain to the right lower aspect of his ribs when he coughs.  States that this is worse with exertion and is also worse with lying flat on his back.  No further fevers or chills.  He still has a frequent cough.  He is currently taking an albuterol inhaler as needed which helps with the symptoms.  No other obvious alleviating or aggravating factors noted.  Symptoms have improved since being discharged from the hospital.  ROS: Per HPI, otherwise a 14 point review of systems was performed and was negative  PMH:  The following were reviewed and entered/updated in epic: History reviewed. No pertinent past medical history. Patient Active Problem List   Diagnosis Date Noted  . Sepsis due to pneumonia (HCC)   . Community acquired pneumonia 03/09/2017  . Cough with hemoptysis 03/09/2017  . Chest pain 03/09/2017    History reviewed. No pertinent surgical history.  History reviewed. No pertinent family history.  Medications- reviewed and updated Current Outpatient Medications  Medication Sig Dispense Refill  . albuterol (PROVENTIL HFA;VENTOLIN HFA) 108 (90 Base) MCG/ACT inhaler Inhale 2 puffs into the lungs every 6 (six) hours as needed for wheezing or shortness of breath. 1 Inhaler 2  . Dextromethorphan HBr 10 MG/15ML SYRP Take 15 mLs (10 mg total) by mouth every 6 (six) hours as needed. 240 mL 2   No current facility-administered medications for this visit.     Allergies-reviewed and updated No Known Allergies  Social History   Socioeconomic History  . Marital status: Married    Spouse name: None  . Number of children: None  . Years of education: None  . Highest education level: None  Social Needs  . Financial resource strain: None  . Food insecurity - worry: None  . Food insecurity - inability: None  . Transportation needs - medical: None  . Transportation needs - non-medical: None  Occupational History  . None  Tobacco Use  . Smoking status: Never Smoker  . Smokeless tobacco: Never Used  Substance and Sexual Activity  . Alcohol use: Yes  . Drug use: No  . Sexual activity: None  Other Topics Concern  . None  Social History Narrative  . None     Objective:  Physical Exam: BP 128/78 (BP Location: Left Arm, Patient Position: Sitting, Cuff Size: Normal)   Pulse 84   Temp 98.3 F (36.8 C) (Oral)   Ht 6'  1" (1.854 m)   Wt 228 lb 9.6 oz (103.7 kg)   SpO2 94%   BMI 30.16 kg/m   Gen: NAD, resting comfortably CV: RRR with no murmurs appreciated Pulm: NWOB, CTAB with no crackles, wheezes, or rhonchi GI: Normal bowel sounds present. Soft, Nontender, Nondistended. MSK: No edema, cyanosis, or clubbing noted Skin: Warm, dry Neuro: Grossly normal, moves all extremities Psych: Normal affect and thought content  Review/summary of labs and imaging while hospitalized: CBC  03/12/2017: WBC 8.9, hemoglobin 12.0, hematocrit 35.7, platelets 227 BMET 03/12/2017: Sodium 138, potassium 3.7, chloride 104, CO2 27, glucose 98, BUN 11, creatinine 0.87, calcium 8.5 RVP 03/10/2017: Positive for rhinovirus Blood culture 03/09/2017: Negative  Chest x-ray 03/09/2017: Right upper lobe pneumonia CTA 03/09/2017: Right upper lobe pneumonia.  Lingular and bilateral lower lobe pneumonia.  No PE.  Assessment/Plan:  No problem-specific Assessment & Plan notes found for this encounter.   Katina Degreealeb M. Jimmey RalphParker, MD 03/21/2017 2:50 PM

## 2017-04-18 ENCOUNTER — Ambulatory Visit (INDEPENDENT_AMBULATORY_CARE_PROVIDER_SITE_OTHER): Payer: 59 | Admitting: Family Medicine

## 2017-04-18 ENCOUNTER — Encounter: Payer: Self-pay | Admitting: Family Medicine

## 2017-04-18 ENCOUNTER — Ambulatory Visit (INDEPENDENT_AMBULATORY_CARE_PROVIDER_SITE_OTHER): Payer: 59

## 2017-04-18 VITALS — BP 124/74 | HR 71 | Temp 98.2°F | Ht 73.0 in | Wt 227.6 lb

## 2017-04-18 DIAGNOSIS — J189 Pneumonia, unspecified organism: Secondary | ICD-10-CM

## 2017-04-18 DIAGNOSIS — R918 Other nonspecific abnormal finding of lung field: Secondary | ICD-10-CM | POA: Diagnosis not present

## 2017-04-18 DIAGNOSIS — J181 Lobar pneumonia, unspecified organism: Secondary | ICD-10-CM | POA: Diagnosis not present

## 2017-04-18 NOTE — Patient Instructions (Signed)
Your pnuemonia looks much better. I am glad you are feeling better.  You can go back to your normal activity level.  Please come back to see me at your convenience for a annual physical, or as needed for other issues.  Take care, Dr Jimmey RalphParker

## 2017-04-18 NOTE — Progress Notes (Signed)
    Subjective:  Joshua Villegas is a 27 y.o. male who presents today with a chief complaint of CAP follow up.   HPI:  CAP, Established Problem, resolved Patient seen in the office about a month ago for hospital follow-up for community acquired pneumonia.  At the office visit he was still having some shortness of breath and cough.  Patient was instructed to use his incentive spirometer at home.  A prescription for diclofenac was also sent in for rib pain.  Today, symptoms have essentially resolved.  No longer having rib pain or shortness of breath. Has not had to use diclofenac in several weeks.   Patient was recommended to have repeat chest x-ray in approximately 4-6 weeks.   ROS: Per HPI  Objective:  Physical Exam: BP 124/74 (BP Location: Left Arm, Patient Position: Sitting, Cuff Size: Normal)   Pulse 71   Temp 98.2 F (36.8 C) (Oral)   Ht 6\' 1"  (1.854 m)   Wt 227 lb 9.6 oz (103.2 kg)   SpO2 97%   BMI 30.03 kg/m   Gen: NAD, resting comfortably CV: RRR with no murmurs appreciated Pulm: NWOB, CTAB with no crackles, wheezes, or rhonchi  CXR: Improvement in right upper lobe opacification.  Right pleural effusionnoted.  (My read).  Assessment/Plan:  CAP  Symptoms have essentially resolved.  Looks like he has a little bit of scarring in his right upper lobe of the sequela of the pneumonia as well as a interval development of a pleural effusion based on my read of his chest x-ray.  We will await radiology read for his chest x-ray.  Given that his symptoms have essentially resolved and he has no chest pain or shortness of breath, he does not need any further specific management at this point.  If develops symptoms, would consider referral for thoracocentesis.   Encouraged patient to return to his previous activity level and let us know if he develops any sort of SOB, chest pain, DOE, etc.   Preventative healthcare Advised patient return soon for CPE.  Katina Degreealeb M. Jimmey RalphParker, MD 04/18/2017 8:37  AM

## 2017-07-28 ENCOUNTER — Ambulatory Visit: Payer: 59 | Admitting: Family Medicine

## 2018-02-02 ENCOUNTER — Ambulatory Visit (INDEPENDENT_AMBULATORY_CARE_PROVIDER_SITE_OTHER): Payer: 59 | Admitting: Family Medicine

## 2018-02-02 ENCOUNTER — Encounter: Payer: Self-pay | Admitting: Family Medicine

## 2018-02-02 ENCOUNTER — Ambulatory Visit: Payer: Self-pay

## 2018-02-02 VITALS — BP 126/70 | HR 73 | Temp 98.3°F | Ht 73.0 in | Wt 244.8 lb

## 2018-02-02 DIAGNOSIS — R61 Generalized hyperhidrosis: Secondary | ICD-10-CM | POA: Diagnosis not present

## 2018-02-02 DIAGNOSIS — B356 Tinea cruris: Secondary | ICD-10-CM

## 2018-02-02 DIAGNOSIS — Z23 Encounter for immunization: Secondary | ICD-10-CM

## 2018-02-02 MED ORDER — ALUMINUM CHLORIDE 20 % EX SOLN: CUTANEOUS | 0 refills | Status: DC

## 2018-02-02 MED ORDER — TRIAMCINOLONE ACETONIDE 0.1 % EX CREA: 1.0000 "application " | TOPICAL_CREAM | Freq: Two times a day (BID) | CUTANEOUS | 0 refills | Status: DC

## 2018-02-02 MED ORDER — KETOCONAZOLE 2 % EX CREA: 1.0000 "application " | TOPICAL_CREAM | Freq: Two times a day (BID) | CUTANEOUS | 0 refills | Status: DC

## 2018-02-02 NOTE — Patient Instructions (Signed)
It was very nice to see you today!  Please start the triamcinolone and ketoconazole.  Please mix 1:1 and apply twice daily.  Please let me know if does not improve in the next 1 to 2 weeks.  Also send in a prescription for aluminum chloride.  Once your irritation clears up, please use this daily to prevent perspiration.  Take care, Dr Jimmey Ralph

## 2018-02-02 NOTE — Telephone Encounter (Signed)
Pt called c/o blood on toilet paper. Pt stated he noted the blood 2 weeks ago and then again yesterday.  Pt has had 3 or 4 episodes of diarrhea. Pt also c/o feeling like their is more stool that needs to comes out and feels constipated as well. Pt stated his anal area is inflamed and painful. Pt denies taking any blood thinners. Pt denies abdominal pain, vomiting, dizziness or fever. Care advice given and pt verbalized understanding. Pt given appointment by agent for 4:20 today with Jacquiline Doe MD   Reason for Disposition . [1] Rectal bleeding is minimal (e.g., blood just on toilet paper, few drops, streaks on surface of normal formed BM) AND [2] bleeding recurs 3 or more times on treatment  Answer Assessment - Initial Assessment Questions 1. APPEARANCE of BLOOD: "What color is it?" "Is it passed separately, on the surface of the stool, or mixed in with the stool?"      Bright red- noted on anus area 2. AMOUNT: "How much blood was passed?"      Mostly on toilet paper - 3. FREQUENCY: "How many times has blood been passed with the stools?"      twice 4. ONSET: "When was the blood first seen in the stools?" (Days or weeks)      Started yesterday but noted it 2 weeks ago 5. DIARRHEA: "Is there also some diarrhea?" If so, ask: "How many diarrhea stools were passed in past 24 hours?"      Yes- 3 or 4  6. CONSTIPATION: "Do you have constipation?" If so, "How bad is it?"     Yes-feels like there is more stool that needs to pass- rectum feels full 7. RECURRENT SYMPTOMS: "Have you had blood in your stools before?" If so, ask: "When was the last time?" and "What happened that time?"      2 weeks ago- was noted on toilet paper only 8. BLOOD THINNERS: "Do you take any blood thinners?" (e.g., Coumadin/warfarin, Pradaxa/dabigatran, aspirin)     no 9. OTHER SYMPTOMS: "Do you have any other symptoms?"  (e.g., abdominal pain, vomiting, dizziness, fever)     no 10. PREGNANCY: "Is there any chance you are  pregnant?" "When was your last menstrual period?"       n/a  Protocols used: RECTAL BLEEDING-A-AH

## 2018-02-02 NOTE — Progress Notes (Signed)
   Subjective:  Joshua Villegas is a 27 y.o. male who presents today with a chief complaint of blood in stool.   HPI:  Blood in stool, acute problem Started a few weeks ago.  Associated with pain and irritation to the area.  No treatments tried.  There is been a recurrent problem for the past 3 months.  He has tried over-the-counter ointments in the past which have helped some.  Patient reports that he sweats quite a bit in the area which he thinks has made the problem worse.  No fevers or chills.  No other obvious alleviating or aggravating factors.  ROS: Per HPI  PMH: He reports that he has never smoked. He has never used smokeless tobacco. He reports that he drinks alcohol. He reports that he does not use drugs.  Objective:  Physical Exam: BP 126/70 (BP Location: Left Arm, Patient Position: Sitting, Cuff Size: Normal)   Pulse 73   Temp 98.3 F (36.8 C) (Oral)   Ht 6\' 1"  (1.854 m)   Wt 244 lb 12.8 oz (111 kg)   SpO2 97%   BMI 32.30 kg/m   Gen: NAD, resting comfortably CV: RRR with no murmurs appreciated Pulm: NWOB, CTAB with no crackles, wheezes, or rhonchi GI: Normal bowel sounds present. Soft, Nontender, Nondistended. GU: Perianal area with significant amount of erythema and maceration involving approximately a 6 cm area around the anus.  Several areas of skin breakdown noted.  No hemorrhoids.  Assessment/Plan:  Hyperhidrosis We will treat his current skin infection with ketoconazole and triamcinolone.  After skin infection clears, will start topical aluminum chloride.  Tinea cruris/intertrigo Likely the source of patient's bloody stool and anal irritation/pain.  No red flag signs or symptoms.  No other abnormalities on his exam.  We will start topical ketoconazole and triamcinolone for the next 2 weeks.  Discussed reasons to return to care.  Preventative Healthcare Flu shot given today.   Katina Degree. Jimmey Ralph, MD 02/02/2018 4:53 PM

## 2018-02-02 NOTE — Assessment & Plan Note (Signed)
We will treat his current skin infection with ketoconazole and triamcinolone.  After skin infection clears, will start topical aluminum chloride.

## 2018-02-22 DIAGNOSIS — J209 Acute bronchitis, unspecified: Secondary | ICD-10-CM | POA: Diagnosis not present

## 2018-02-25 ENCOUNTER — Telehealth: Payer: Self-pay | Admitting: Family Medicine

## 2018-02-25 NOTE — Telephone Encounter (Signed)
Spoke with patient he went to urgent care,prescribed an antibiotic. Patient stated he is doing better.

## 2018-02-25 NOTE — Telephone Encounter (Signed)
Caller stated that he is more fatigued than normal (wife called it lethargic). He is coughing up blood. His throat is swollen and he's having some issues breathing. Doesn't have a thermometer to see if he has a fever. The s/s started last night.   PER [employer]

## 2018-06-15 IMAGING — DX DG CHEST 2V
2 series · 2 of 2 positions shown · non-contrast
Comparison: Radiograph March 10, 2017.

CLINICAL DATA: Pneumonia.

EXAM:
CHEST  2 VIEW

[chest pa]
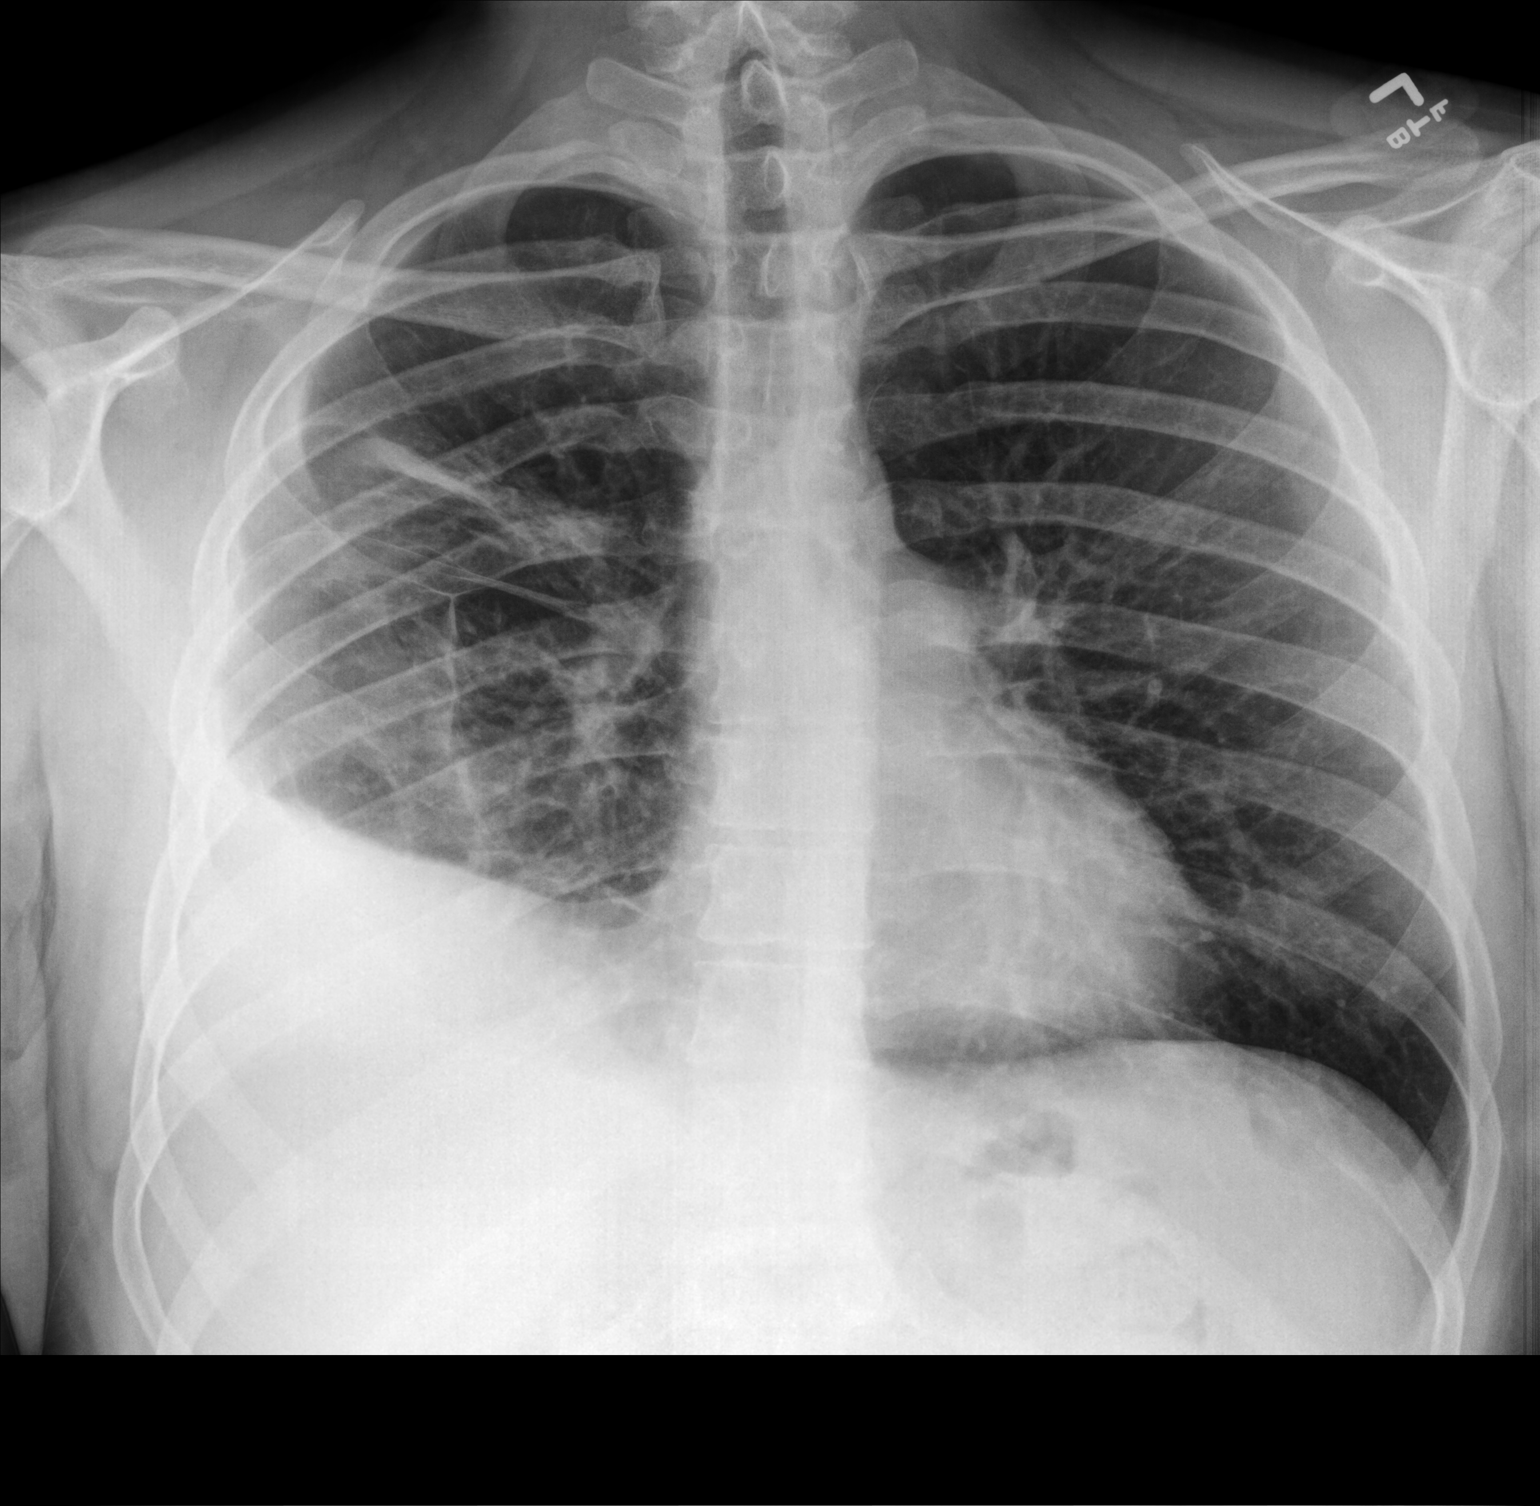

[chest lat]
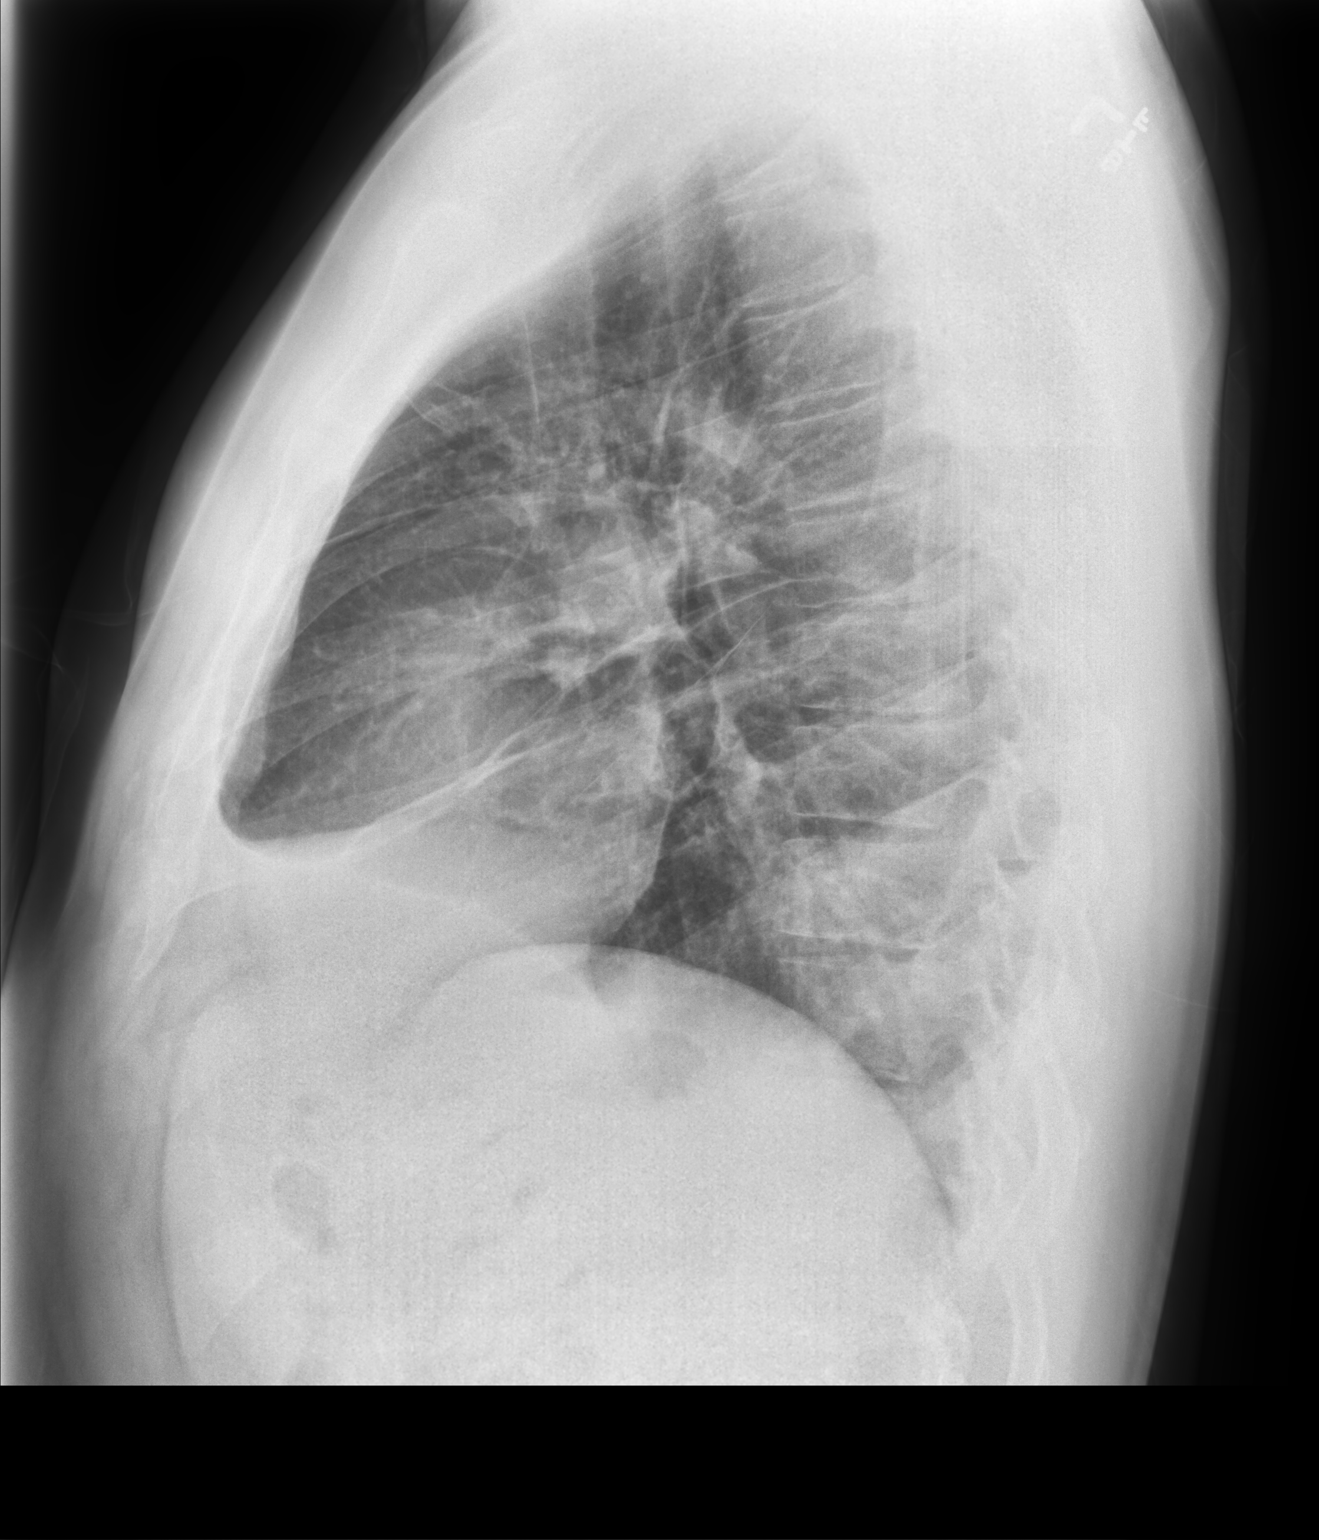

[2 of 2 positions shown; findings below may reference images not displayed]

FINDINGS: The heart size and mediastinal contours are within normal limits. No
pneumothorax is noted. Left lung is clear. Right upper lobe opacity
is significantly improved compared to prior exam, with residual
inflammation or atelectasis is noted. Mild right pleural effusion is
noted with associated atelectasis or infiltrate. The visualized
skeletal structures are unremarkable.
IMPRESSION: Significantly improved right upper lobe opacity is noted, with
residual atelectasis or infiltrate present. Mild right pleural
effusion is now noted with associated atelectasis or infiltrate.

## 2018-09-01 ENCOUNTER — Ambulatory Visit: Payer: Self-pay | Admitting: Family Medicine

## 2018-09-01 NOTE — Telephone Encounter (Signed)
Understand that patient is out of town, but he can still have virtual visit.  Please call patient and schedule.  Thanks!

## 2018-09-01 NOTE — Telephone Encounter (Signed)
Pt called with having constipation because his rectum is swollen and irritated. He said the same thing happened to him last November and he was given an antibiotic that helped him. He stated that it was a staph infection.  The rectal area stays moist which causes him irritation. He denies fever or abd pain. He just had a bowel movement and had to remove it manually. The stool was soft and "acidic" per pt. Advised that since he was out of town he should go to an ED or urgent care to be treated. He would like for his pcp to give him a call back regarding the medications. Notified LB at [facility]. Routed to the office for review and recommendation.  Pt voiced understanding. uReason for Disposition . MODERATE-SEVERE rectal pain (i.e., interferes with school, work, or sleep)  Answer Assessment - Initial Assessment Questions 1. STOOL PATTERN OR FREQUENCY: "How often do you pass bowel movements (BMs)?"  (Normal range: tid to q 3 days)  "When was the last BM passed?"       Usually once a day 2. STRAINING: "Do you have to strain to have a BM?"      yes 3. RECTAL PAIN: "Does your rectum hurt when the stool comes out?" If so, ask: "Do you have hemorrhoids? How bad is the pain?"  (Scale 1-10; or mild, moderate, severe)     Yes and even when not 4. STOOL COMPOSITION: "Are the stools hard?"      no 5. BLOOD ON STOOLS: "Has there been any blood on the toilet tissue or on the surface of the BM?" If so, ask: "When was the last time?"      Blood  On tissue 6. CHRONIC CONSTIPATION: "Is this a new problem for you?"  If no, ask: "How long have you had this problem?" (days, weeks, months)      Over the last 2 to 3 weeks has been having constipation 7. CHANGES IN DIET: "Have there been any recent changes in your diet?"      no 8. MEDICATIONS: "Have you been taking any new medications?"     No  9. LAXATIVES: "Have you been using any laxatives or enemas?"  If yes, ask "What, how often, and when was the  last time?"     no 10. CAUSE: "What do you think is causing the constipation?"        staphyl infection? 11. OTHER SYMPTOMS: "Do you have any other symptoms?" (e.g., abdominal pain, fever, vomiting)       none 12. PREGNANCY: "Is there any chance you are pregnant?" "When was your last menstrual period?"       no  Answer Assessment - Initial Assessment Questions 1. SYMPTOM:  "What's the main symptom you're concerned about?" (e.g., pain, itching, swelling, rash)     Swelling and pain 2. ONSET: "When did the pain  start?"     2 to 3  weeks ago 3. RECTAL PAIN: "Do you have any pain around your rectum?" "How bad is the pain?"  (Scale 1-10; or mild, moderate, severe)  - MILD (1-3): doesn't interfere with normal activities   - MODERATE (4-7): interferes with normal activities or awakens from sleep, limping   - SEVERE (8-10): excruciating pain, unable to have a bowel movement      Yes painful 4. RECTAL ITCHING: "Do you have any itching in this area?" "How bad is the itching?"  (Scale 1-10; or mild, moderate, severe)  - MILD - doesn't  interfere with normal activities   - MODERATE - SEVERE: interferes with normal activities or awakens from sleep     No itching 5. CONSTIPATION: "Do you have constipation?" If so, "How bad is it?"     Yes have to manually remove the stool 6. CAUSE: "What do you think is causing the anus symptoms?"     Staphyl infection  7. OTHER SYMPTOMS: "Do you have any other symptoms?"  (e.g., rectal bleeding, abdominal pain, vomiting, fever)     Rectal bleeding and raw area 8. PREGNANCY: "Is there any chance you are pregnant?" "When was your last menstrual period?"     n/a  Protocols used: RECTAL SYMPTOMS-A-AH, CONSTIPATION-A-AH

## 2018-09-01 NOTE — Telephone Encounter (Signed)
See note

## 2018-09-02 ENCOUNTER — Ambulatory Visit (INDEPENDENT_AMBULATORY_CARE_PROVIDER_SITE_OTHER): Payer: 59 | Admitting: Family Medicine

## 2018-09-02 ENCOUNTER — Encounter: Payer: Self-pay | Admitting: Family Medicine

## 2018-09-02 DIAGNOSIS — L0390 Cellulitis, unspecified: Secondary | ICD-10-CM | POA: Diagnosis not present

## 2018-09-02 DIAGNOSIS — B356 Tinea cruris: Secondary | ICD-10-CM | POA: Diagnosis not present

## 2018-09-02 DIAGNOSIS — L649 Androgenic alopecia, unspecified: Secondary | ICD-10-CM | POA: Diagnosis not present

## 2018-09-02 MED ORDER — FINASTERIDE 1 MG PO TABS: 1.0000 mg | ORAL_TABLET | Freq: Every day | ORAL | 3 refills | Status: AC

## 2018-09-02 MED ORDER — DOXYCYCLINE HYCLATE 100 MG PO TABS: 100.0000 mg | ORAL_TABLET | Freq: Two times a day (BID) | ORAL | 0 refills | Status: AC

## 2018-09-02 MED ORDER — KETOCONAZOLE 2 % EX CREA: 1.0000 "application " | TOPICAL_CREAM | Freq: Two times a day (BID) | CUTANEOUS | 0 refills | Status: AC

## 2018-09-02 MED ORDER — TRIAMCINOLONE ACETONIDE 0.1 % EX CREA: 1.0000 "application " | TOPICAL_CREAM | Freq: Two times a day (BID) | CUTANEOUS | 0 refills | Status: AC

## 2018-09-02 NOTE — Assessment & Plan Note (Signed)
Refilled finasteride

## 2018-09-02 NOTE — Progress Notes (Signed)
   Chief Complaint:  Joshua Villegas is a 28 y.o. male who presents today for a virtual office visit with a chief complaint of rectal pain.   Assessment/Plan:  Rectal Pain Similar to prior episode of severe tinea cruris with bacterial superinfection.  Will initiate same treatment that he got at that time with topical ketoconazole, topical triamcinolone, and oral doxycycline.  Discussed reasons to return to care and seek emergent care.  Follow-up as needed.  Androgenic alopecia Refilled finasteride.     Subjective:  HPI:  Rectal Pain Started 2-3 days ago. Has a significant amount of pain with bowel movements.  He had symptoms similar in the past which was treated with a combination of topical ketoconazole, topical triamcinolone, and oral doxycycline.  Symptoms have worsened over the last couple of days.  No fevers or chills.  No nausea or vomiting.  No other obvious alleviating or aggravating factors.  # Androgenic Alopecia Several year history.  New problem provider.  Is been on Propecia 1 mg daily for the past several years.  Tolerating this well.  Requests refill today.  No noted side effects. Several year history    ROS: Per HPI  PMH: He reports that he has never smoked. He has never used smokeless tobacco. He reports current alcohol use. He reports that he does not use drugs.      Objective/Observations  Physical Exam: Gen: NAD, resting comfortably Pulm: Normal work of breathing Neuro: Grossly normal, moves all extremities Psych: Normal affect and thought content  Virtual Visit via Video   I connected with Dawit Correira on 09/02/18 at 10:40 AM EDT by a video enabled telemedicine application and verified that I am speaking with the correct person using two identifiers. I discussed the limitations of evaluation and management by telemedicine and the availability of in person appointments. The patient expressed understanding and agreed to proceed.   Patient location: Home Provider  location: [facility] [employer] [facility] Persons participating in the virtual visit: Myself and Patient     Katina Degree. Jimmey Ralph, MD 09/02/2018 11:07 AM

## 2018-09-02 NOTE — Telephone Encounter (Signed)
Patient is scheduled for 10:40am today for a virtual visit.

## 2018-09-11 ENCOUNTER — Ambulatory Visit: Payer: Self-pay | Admitting: *Deleted

## 2018-09-11 NOTE — Telephone Encounter (Signed)
Pt called stating that he was exposed to a co-worker who tested positive for COVID on 09/06/2018 and 09/07/2018; the pt says that starting on 09/10/2018 he has been having muscle/joint aches, sore throat and cough; he has also been feeling feverish but does not have a thermometer with him; he is concerned because he had pneumonia in the past and was in ICU; he also has children at home; recommendations made per nurse triage protocol; he verbalized, and states that he is enroute from [place], and will not be back in the area until after 2000 today; phone malfunctioned before pt could be transferred to [place] for scheduling; spoke with Watt Climes in the office and she will contact the pt.  Reason for Disposition . HIGH RISK patient (e.g., age > 89 years, diabetes, heart or lung disease, weak immune system)  Answer Assessment - Initial Assessment Questions 1. CLOSE CONTACT: "Who is the person with the confirmed or suspected COVID-19 infection that you were exposed to?"      2. PLACE of CONTACT: "Where were you when you were exposed to COVID-19?" (e.g., home, school, medical waiting room; which city?)     *No Answer* 3. TYPE of CONTACT: "How much contact was there?" (e.g., sitting next to, live in same house, work in same office, same building)     *No Answer* 4. DURATION of CONTACT: "How long were you in contact with the COVID-19 patient?" (e.g., a few seconds, passed by person, a few minutes, live with the patient)     *No Answer* 5. DATE of CONTACT: "When did you have contact with a COVID-19 patient?" (e.g., how many days ago)     June 7 and 8 6. TRAVEL: "Have you traveled out of the country recently?" If so, "When and where?"     * Also ask about out-of-state travel, since the CDC has identified some high-risk cities for community spread in the [place].     * Note: Travel becomes less relevant if there is widespread community transmission where the patient lives.   no 7. COMMUNITY SPREAD: "Are there  lots of cases of COVID-19 (community spread) where you live?" (See public health department website, if unsure)      Major community spread 8. SYMPTOMS: "Do you have any symptoms?" (e.g., fever, cough, breathing difficulty)    history of PNA; in ICU 9. PREGNANCY OR POSTPARTUM: "Is there any chance you are pregnant?" "When was your last menstrual period?" "Did you deliver in the last 2 weeks?"     *No Answer* 10. HIGH RISK: "Do you have any heart or lung problems? Do you have a weak immune system?" (e.g., CHF, COPD, asthma, HIV positive, chemotherapy, renal failure, diabetes mellitus, sickle cell anemia)       *No Answer*  Protocols used: CORONAVIRUS (COVID-19) DIAGNOSED OR SUSPECTED-A-AH, CORONAVIRUS (COVID-19) EXPOSURE-A-AH

## 2018-09-12 ENCOUNTER — Encounter: Payer: Self-pay | Admitting: Family Medicine

## 2018-09-12 ENCOUNTER — Other Ambulatory Visit: Payer: Self-pay

## 2018-09-12 ENCOUNTER — Ambulatory Visit (INDEPENDENT_AMBULATORY_CARE_PROVIDER_SITE_OTHER): Payer: 59 | Admitting: Family Medicine

## 2018-09-12 DIAGNOSIS — Z7189 Other specified counseling: Secondary | ICD-10-CM

## 2018-09-12 DIAGNOSIS — Z20828 Contact with and (suspected) exposure to other viral communicable diseases: Secondary | ICD-10-CM

## 2018-09-12 NOTE — Progress Notes (Signed)
Chief Complaint  Patient presents with  . Cough    fever bodyaches diarrhea     Subjective: Patient is a 28 y.o. male here for COVID-19 discussion. Due to COVID-19 pandemic, we are interacting via web portal for an electronic face-to-face visit. I verified patient's ID using 2 identifiers. Patient agreed to proceed with visit via this method. Patient is at hotel, I am at office. Patient and I are present for visit.   Pt was exposed to person who tested positive for COVID-19 around 6 d ago. Hehad been doing fine until 2 nights ago where he had subjective fevers, sweats, ache, cough, some trouble breathing. This has waxed and waned for him.   ROS: Const: +subj fevers Lungs: +cough  No known medical problems  Objective: No conversational dyspnea Age appropriate judgment and insight Nml affect and mood  Assessment and Plan: Advice Given About Covid-19 Virus Infection - Plan: Health dept number provided, discussed self quarantine measures and for how long. He has been improving since Th night, that is his day 0. Fever free for 3 d and improving s/s's for 7 d. Discussed weekend testing at [facility] urgent cares. He will look into it. Warning signs and symptoms verbalized.  The patient voiced understanding and agreement to the plan.  [place], DO 09/12/18  9:49 AM

## 2018-09-14 NOTE — Telephone Encounter (Signed)
Looks like pt had appt 6/13. Forwarding to Dr. Jerline Pain as Juluis Rainier.

## 2018-09-17 ENCOUNTER — Encounter (HOSPITAL_COMMUNITY): Payer: Self-pay

## 2018-09-17 ENCOUNTER — Emergency Department (HOSPITAL_COMMUNITY)
Admission: EM | Admit: 2018-09-17 | Discharge: 2018-09-17 | Disposition: A | Discharge status: Home / Self Care | Payer: 59 | Attending: Emergency Medicine | Admitting: Emergency Medicine

## 2018-09-17 ENCOUNTER — Emergency Department (HOSPITAL_COMMUNITY): Payer: 59

## 2018-09-17 ENCOUNTER — Ambulatory Visit: Payer: Self-pay | Admitting: Family Medicine

## 2018-09-17 ENCOUNTER — Other Ambulatory Visit: Payer: Self-pay

## 2018-09-17 DIAGNOSIS — Z79899 Other long term (current) drug therapy: Secondary | ICD-10-CM | POA: Diagnosis not present

## 2018-09-17 DIAGNOSIS — R042 Hemoptysis: Secondary | ICD-10-CM | POA: Insufficient documentation

## 2018-09-17 DIAGNOSIS — U071 COVID-19: Secondary | ICD-10-CM | POA: Insufficient documentation

## 2018-09-17 DIAGNOSIS — R0602 Shortness of breath: Secondary | ICD-10-CM | POA: Diagnosis present

## 2018-09-17 HISTORY — DX: Pneumonia, unspecified organism: J18.9

## 2018-09-17 LAB — CBC WITH DIFFERENTIAL/PLATELET
Abs Immature Granulocytes: 0.01 10*3/uL (ref 0.00–0.07)
Basophils Absolute: 0 10*3/uL (ref 0.0–0.1)
Basophils Relative: 0 %
Eosinophils Absolute: 0 10*3/uL (ref 0.0–0.5)
Eosinophils Relative: 1 %
HCT: 46.4 % (ref 39.0–52.0)
Hemoglobin: 15.4 g/dL (ref 13.0–17.0)
Immature Granulocytes: 0 %
Lymphocytes Relative: 34 %
Lymphs Abs: 1.1 10*3/uL (ref 0.7–4.0)
MCH: 27.9 pg (ref 26.0–34.0)
MCHC: 33.2 g/dL (ref 30.0–36.0)
MCV: 84.1 fL (ref 80.0–100.0)
Monocytes Absolute: 0.2 10*3/uL (ref 0.1–1.0)
Monocytes Relative: 6 %
Neutro Abs: 1.9 10*3/uL (ref 1.7–7.7)
Neutrophils Relative %: 59 %
Platelets: 177 10*3/uL (ref 150–400)
RBC: 5.52 MIL/uL (ref 4.22–5.81)
RDW: 13 % (ref 11.5–15.5)
WBC: 3.3 10*3/uL — ABNORMAL LOW (ref 4.0–10.5)
nRBC: 0 % (ref 0.0–0.2)

## 2018-09-17 LAB — COMPREHENSIVE METABOLIC PANEL
ALT: 21 U/L (ref 0–44)
AST: 24 U/L (ref 15–41)
Albumin: 4.5 g/dL (ref 3.5–5.0)
Alkaline Phosphatase: 64 U/L (ref 38–126)
Anion gap: 9 (ref 5–15)
BUN: 10 mg/dL (ref 6–20)
CO2: 26 mmol/L (ref 22–32)
Calcium: 8.9 mg/dL (ref 8.9–10.3)
Chloride: 106 mmol/L (ref 98–111)
Creatinine, Ser: 0.89 mg/dL (ref 0.61–1.24)
GFR calc Af Amer: >60 mL/min (ref >60)
GFR calc non Af Amer: >60 mL/min (ref >60)
Glucose, Bld: 95 mg/dL (ref 70–99)
Potassium: 4.7 mmol/L (ref 3.5–5.1)
Sodium: 141 mmol/L (ref 135–145)
Total Bilirubin: 0.5 mg/dL (ref 0.3–1.2)
Total Protein: 7.7 g/dL (ref 6.5–8.1)

## 2018-09-17 MED ORDER — LEVOFLOXACIN 500 MG PO TABS: 500.0000 mg | ORAL_TABLET | Freq: Every day | ORAL | 0 refills | Status: AC

## 2018-09-17 MED ORDER — SODIUM CHLORIDE (PF) 0.9 % IJ SOLN
INTRAMUSCULAR | Status: AC
  Filled 2018-09-17: qty 50

## 2018-09-17 MED ORDER — IOHEXOL 350 MG/ML SOLN
100.0000 mL | Freq: Once | INTRAVENOUS | Status: AC | PRN
  Administered 2018-09-17: 100 mL via INTRAVENOUS

## 2018-09-17 NOTE — ED Notes (Signed)
Per outpatient resource staff-patient was tested for COVID on Monday, results are not back-states symptoms getting worse-coughing up blood, fever, sweats and body aches-sending for eval

## 2018-09-17 NOTE — ED Triage Notes (Signed)
Patient reports that he has had SOB, cough, diarrhea, abdominal pain, chills, and body aches x 8 days. Patient states he had a virtual visit with his physician 5 days ago and a Covid test 3 days ago, but no results . Patient states symptoms are worsening.   Patient has been around another person who was postive for Covid-19 and traveled to [place].

## 2018-09-17 NOTE — Telephone Encounter (Signed)
Pt reports tested for covid 19 Monday for similar symptoms, results pending. States still with subjective fever, chills, sweating, body aches, severe cough. States is now coughing up "Dark red blood." Reports mild SOB on exertion which is also new symptom. Pt directed to ED. [place] called ahead to [facility] ED, Erline Levine, to alert of pts arrival. Pt given instructions, verbalizes understanding.  Reason for Disposition . Patient sounds very sick or weak to the triager  Answer Assessment - Initial Assessment Questions 1. COVID-19 DIAGNOSIS: "Who made your Coronavirus (COVID-19) diagnosis?" "Was it confirmed by a positive lab test?" If not diagnosed by a HCP, ask "Are there lots of cases (community spread) where you live?" (See public health department website, if unsure)     NA 2. ONSET: "When did the COVID-19 symptoms start?"      09/10/2018 3. WORST SYMPTOM: "What is your worst symptom?" (e.g., cough, fever, shortness of breath, muscle aches)    cough 4. COUGH: "Do you have a cough?" If so, ask: "How bad is the cough?"       severe 5. FEVER: "Do you have a fever?" If so, ask: "What is your temperature, how was it measured, and when did it start?"    subjective fever, sweating, chills 6. RESPIRATORY STATUS: "Describe your breathing?" (e.g., shortness of breath, wheezing, unable to speak)      SOB on exertion 7. BETTER-SAME-WORSE: "Are you getting better, staying the same or getting worse compared to yesterday?"  If getting worse, ask, "In what way?"     worse 8. HIGH RISK DISEASE: "Do you have any chronic medical problems?" (e.g., asthma, heart or lung disease, weak immune system, etc.)      10. OTHER SYMPTOMS: "Do you have any other symptoms?"  (e.g., chills, fatigue, headache, loss of smell or taste, muscle pain, sore throat)      Body aches  Protocols used: CORONAVIRUS (COVID-19) DIAGNOSED OR SUSPECTED-A-AH

## 2018-09-17 NOTE — ED Provider Notes (Signed)
[facility]-EMERGENCY DEPT Provider Note   CSN: [id number] Arrival date & time: 09/17/18  [id number]     History   Chief Complaint Chief Complaint  Patient presents with  . Shortness of Breath  . Cough  . Generalized Body Aches  . Chills  . Diarrhea    HPI Joshua Villegas is a [age] y.o. male.  Who presents emergency department with complaint of hemoptysis.  Patient has had 8 days of cough, body aches, chills, exertional dyspnea, decreased appetite and mild nausea.  He has had intermittent fevers at home.  He was tested in the outpatient setting for COVID-19 and while waiting to be seen here in the emergency department got a phone call and was told he is positive.  Primarily the patient is concerned because he has had worsening hemoptysis.  The patient shows me a picture on his phone of expectorated sputum which is full of blood.  He denies frank bloody cough or clots.  He is concerned because he previously ended up in the ICU with bilateral pneumonia a year ago.  He denies unilateral leg swelling history of DVT or PE.  He does not feel short of breath at rest.     HPI  Past Medical History:  Diagnosis Date  . Pneumonia     Patient Active Problem List   Diagnosis Date Noted  . Androgenic alopecia 09/02/2018  . Hyperhidrosis 02/02/2018    History reviewed. No pertinent surgical history.      Home Medications    Prior to Admission medications   Medication Sig Start Date End Date Taking? Authorizing Provider  albuterol (PROVENTIL HFA;VENTOLIN HFA) 108 (90 Base) MCG/ACT inhaler Inhale 2 puffs into the lungs every 6 (six) hours as needed for wheezing or shortness of breath. 03/14/17   Lanelle BalHarbrecht, Lawrence, MD  doxycycline (VIBRA-TABS) 100 MG tablet Take 1 tablet (100 mg total) by mouth 2 (two) times daily. 09/02/18   Ardith DarkParker, Caleb M, MD  finasteride (PROPECIA) 1 MG tablet Take 1 tablet (1 mg total) by mouth daily. 09/02/18   Ardith DarkParker, Caleb M, MD  ketoconazole (NIZORAL) 2 %  cream Apply 1 application topically 2 (two) times daily. 09/02/18   Ardith DarkParker, Caleb M, MD  triamcinolone cream (KENALOG) 0.1 % Apply 1 application topically 2 (two) times daily. 09/02/18   Ardith DarkParker, Caleb M, MD    Family History Family History  Problem Relation Age of Onset  . Healthy Mother   . Healthy Father     Social History Social History   Tobacco Use  . Smoking status: Never Smoker  . Smokeless tobacco: Never Used  Substance Use Topics  . Alcohol use: Yes  . Drug use: No     Allergies   Patient has no known allergies.   Review of Systems Review of Systems Ten systems reviewed and are negative for acute change, except as noted in the HPI.    Physical Exam Updated Vital Signs BP 140/83 (BP Location: Right Arm)   Pulse 89   Temp 98.1 F (36.7 C) (Oral)   Resp 18   Ht 6\' 1"  (1.854 m)   Wt 104.3 kg   SpO2 100%   BMI 30.34 kg/m   Physical Exam Vitals signs and nursing note reviewed.  Constitutional:      General: He is not in acute distress.    Appearance: He is well-developed. He is ill-appearing. He is not diaphoretic.  HENT:     Head: Normocephalic and atraumatic.  Eyes:  General: No scleral icterus.    Conjunctiva/sclera: Conjunctivae normal.  Neck:     Musculoskeletal: Normal range of motion and neck supple.  Cardiovascular:     Rate and Rhythm: Normal rate and regular rhythm.     Heart sounds: Normal heart sounds.  Pulmonary:     Effort: Pulmonary effort is normal. No tachypnea or respiratory distress.     Breath sounds: Normal breath sounds. No wheezing.     Comments: Cough Abdominal:     Palpations: Abdomen is soft.     Tenderness: There is no abdominal tenderness.  Musculoskeletal:     Right lower leg: No edema.     Left lower leg: No edema.  Skin:    General: Skin is warm and dry.  Neurological:     Mental Status: He is alert.  Psychiatric:        Behavior: Behavior normal.      ED Treatments / Results  Labs (all labs ordered are  listed, but only abnormal results are displayed) Labs Reviewed - No data to display  EKG    Radiology No results found.  Procedures Procedures (including critical care time)  Medications Ordered in ED Medications - No data to display   Initial Impression / Assessment and Plan / ED Course  I have reviewed the triage vital signs and the nursing notes.  Pertinent labs & imaging results that were available during my care of the patient were reviewed by me and considered in my medical decision making (see chart for details).        28 year old male with a history of previous bilateral pneumonia which landed him in the ICU about 1 year ago.  Here with hemoptysis.  He is positive for COVID-19.  Differential includes superimposed bacterial pneumonia, pulmonary embolus, viral pneumonia. Will obtain a CT angiogram to rule out PE as given that he is positive for coronavirus he will likely have an elevated d-dimer.  If negative I will treat the patient for bacterial pneumonia with atypical coverage.  Patient currently appears ill but nontoxic.  He has a normal respiratory rate, he is hemodynamically stable and afebrile with no hypoxia on room air.    Joshua Villegas was evaluated in Emergency Department on 09/17/2018 for the symptoms described in the history of present illness. He was evaluated in the context of the global COVID-19 pandemic, which necessitated consideration that the patient might be at risk for infection with the SARS-CoV-2 virus that causes COVID-19. Institutional protocols and algorithms that pertain to the evaluation of patients at risk for COVID-19 are in a state of rapid change based on information released by regulatory bodies including the CDC and federal and state organizations. These policies and algorithms were followed during the patient's care in the ED.    Patient here with positive coronavirus and hemoptysis.  I reviewed the patient's labs which shows a mild leukopenia,  no lymphopenia.  CMP negative for any abnormalities.  T angiogram of the chest shows groundglass opacities without evidence of pulmonary embolus.  Has been taking doxycycline.  Will switch to Levaquin 500 mg daily for 7 days. Is afebrile and hemodynamically stable without hypoxia on room air.  Appears appropriate for discharge at this time.  I have discussed return precautions Final Clinical Impressions(s) / ED Diagnoses   Final diagnoses:  None    ED Discharge Orders    None       Margarita Mail, PA-C 09/17/18 1732    Francine Graven, DO 09/21/18 1443

## 2018-09-17 NOTE — ED Notes (Signed)
Discharge instructions reviewed with patient. Patient verbalizes understanding. VSS.   

## 2018-09-17 NOTE — Telephone Encounter (Signed)
Patient has arrived at ED.

## 2018-09-17 NOTE — Discharge Instructions (Addendum)
Person Under Monitoring Name: Joshua Villegas  Location: [address] [place] [id number]   Infection Prevention Recommendations for Individuals Confirmed to have, or Being Evaluated for, 2019 Novel Coronavirus (COVID-19) Infection Who Receive Care at Home  Individuals who are confirmed to have, or are being evaluated for, COVID-19 should follow the prevention steps below until a healthcare provider or local or state health department says they can return to normal activities.  Stay home except to get medical care You should restrict activities outside your home, except for getting medical care. Do not go to work, school, or public areas, and do not use public transportation or taxis.  Call ahead before visiting your doctor Before your medical appointment, call the healthcare provider and tell them that you have, or are being evaluated for, COVID-19 infection. This will help the healthcare providers office take steps to keep other people from getting infected. Ask your healthcare provider to call the local or state health department.  Monitor your symptoms Seek prompt medical attention if your illness is worsening (e.g., difficulty breathing). Before going to your medical appointment, call the healthcare provider and tell them that you have, or are being evaluated for, COVID-19 infection. Ask your healthcare provider to call the local or state health department.  Wear a facemask You should wear a facemask that covers your nose and mouth when you are in the same room with other people and when you visit a healthcare provider. People who live with or visit you should also wear a facemask while they are in the same room with you.  Separate yourself from other people in your home As much as possible, you should stay in a different room from other people in your home. Also, you should use a separate bathroom, if available.  Avoid sharing household items You should not share  dishes, drinking glasses, cups, eating utensils, towels, bedding, or other items with other people in your home. After using these items, you should wash them thoroughly with soap and water.  Cover your coughs and sneezes Cover your mouth and nose with a tissue when you cough or sneeze, or you can cough or sneeze into your sleeve. Throw used tissues in a lined trash can, and immediately wash your hands with soap and water for at least 20 seconds or use an alcohol-based hand rub.  Wash your [employer]hands Wash your hands often and thoroughly with soap and water for at least 20 seconds. You can use an alcohol-based hand sanitizer if soap and water are not available and if your hands are not visibly dirty. Avoid touching your eyes, nose, and mouth with unwashed hands.   Prevention Steps for Caregivers and Household Members of Individuals Confirmed to have, or Being Evaluated for, COVID-19 Infection Being Cared for in the Home  If you live with, or provide care at home for, a person confirmed to have, or being evaluated for, COVID-19 infection please follow these guidelines to prevent infection:  Follow healthcare providers instructions Make sure that you understand and can help the patient follow any healthcare provider instructions for all care.  Provide for the patients basic needs You should help the patient with basic needs in the home and provide support for getting groceries, prescriptions, and other personal needs.  Monitor the patients symptoms If they are getting sicker, call his or her medical provider and tell them that the patient has, or is being evaluated for, COVID-19 infection. This will help the healthcare providers office  take steps to keep other people from getting infected. °Ask the healthcare provider to call the local or state health department. ° °Limit the number of people who have contact with the patient °If possible, have only one caregiver for the patient. °Other  household members should stay in another home or place of residence. If this is not possible, they should stay °in another room, or be separated from the patient as much as possible. Use a separate bathroom, if available. °Restrict visitors who do not have an essential need to be in the home. ° °Keep older adults, very young children, and other sick people away from the patient °Keep older adults, very young children, and those who have compromised immune systems or chronic health conditions away from the patient. This includes people with chronic heart, lung, or kidney conditions, diabetes, and cancer. ° °Ensure good ventilation °Make sure that shared spaces in the home have good air flow, such as from an air conditioner or an opened window, °weather permitting. ° °Wash your hands often °Wash your hands often and thoroughly with soap and water for at least 20 seconds. You can use an alcohol based hand sanitizer if soap and water are not available and if your hands are not visibly dirty. °Avoid touching your eyes, nose, and mouth with unwashed hands. °Use disposable paper towels to dry your hands. If not available, use dedicated cloth towels and replace them when they become wet. ° °Wear a facemask and gloves °Wear a disposable facemask at all times in the room and gloves when you touch or have contact with the patient’s blood, body fluids, and/or secretions or excretions, such as sweat, saliva, sputum, nasal mucus, vomit, urine, or feces.  Ensure the mask fits over your nose and mouth tightly, and do not touch it during use. °Throw out disposable facemasks and gloves after using them. Do not reuse. °Wash your hands immediately after removing your facemask and gloves. °If your personal clothing becomes contaminated, carefully remove clothing and launder. Wash your hands after handling contaminated clothing. °Place all used disposable facemasks, gloves, and other waste in a lined container before disposing them with  other household waste. °Remove gloves and wash your hands immediately after handling these items. ° °Do not share dishes, glasses, or other household items with the patient °Avoid sharing household items. You should not share dishes, drinking glasses, cups, eating utensils, towels, bedding, or other items with a patient who is confirmed to have, or being evaluated for, COVID-19 infection. °After the person uses these items, you should wash them thoroughly with soap and water. ° °Wash laundry thoroughly °Immediately remove and wash clothes or bedding that have blood, body fluids, and/or secretions or excretions, such as sweat, saliva, sputum, nasal mucus, vomit, urine, or feces, on them. °Wear gloves when handling laundry from the patient. °Read and follow directions on labels of laundry or clothing items and detergent. In general, wash and dry with the warmest temperatures recommended on the label. ° °Clean all areas the individual has used often °Clean all touchable surfaces, such as counters, tabletops, doorknobs, bathroom fixtures, toilets, phones, keyboards, tablets, and bedside tables, every day. Also, clean any surfaces that may have blood, body fluids, and/or secretions or excretions on them. °Wear gloves when cleaning surfaces the patient has come in contact with. °Use a diluted bleach solution (e.g., dilute bleach with 1 part bleach and 10 parts water) or a household disinfectant with a label that says EPA-registered for coronaviruses. To make a bleach   solution at home, add 1 tablespoon of bleach to 1 quart (4 cups) of water. For a larger supply, add  cup of bleach to 1 gallon (16 cups) of water. Read labels of cleaning products and follow recommendations provided on product labels. Labels contain instructions for safe and effective use of the cleaning product including precautions you should take when applying the product, such as wearing gloves or eye protection and making sure you have good ventilation  during use of the product. Remove gloves and wash hands immediately after cleaning.  Monitor yourself for signs and symptoms of illness Caregivers and household members are considered close contacts, should monitor their health, and will be asked to limit movement outside of the home to the extent possible. Follow the monitoring steps for close contacts listed on the symptom monitoring form.   ? If you have additional questions, contact your local health department or call the epidemiologist on call at [phone] (available 24/7). ? This guidance is subject to change. For the most up-to-date guidance from [facility], please refer to their website: YouBlogs.pl

## 2018-09-17 NOTE — ED Notes (Signed)
Patient transported to CT 

## 2018-09-17 NOTE — ED Notes (Signed)
Patient is Covid 19+.

## 2019-11-14 IMAGING — CT CT ANGIOGRAPHY CHEST
2 of 6 series · 17 of 46 positions shown · IV contrast (OMNIPAQUE)
Comparison: Chest radiographs, 04/18/2017.  Chest CT, 03/09/2017.

CLINICAL DATA: Pt reports tested for covid 19 [REDACTED] for similar
symptoms, results pending. States still with subjective fever,
chills, sweating, body aches, severe cough. States is now coughing
up "Dark red blood." Reports mild SOB on exertion which is also new
symptom. Pt directed to ED.

EXAM:
CT ANGIOGRAPHY CHEST WITH CONTRAST
TECHNIQUE: Multidetector CT imaging of the chest was performed using the
standard protocol during bolus administration of intravenous
contrast. Multiplanar CT image reconstructions and MIPs were
obtained to evaluate the vascular anatomy.
CONTRAST:  100mL OMNIPAQUE IOHEXOL 350 MG/ML SOLN

[Series 5: thins · axial · 0.92mm/px · z∈[-263,-30]mm · 14 of 257 slices shown]
[im 12/257  lung]
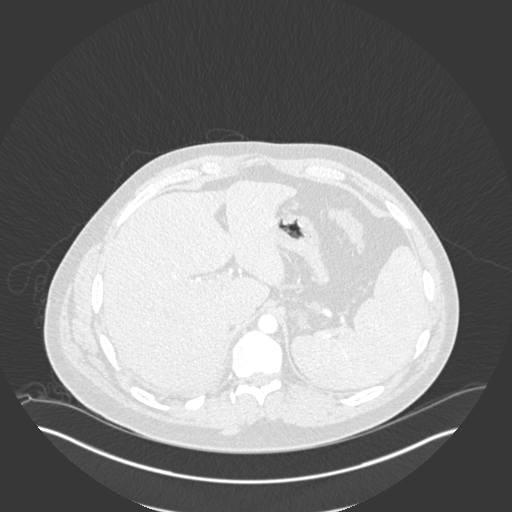
[im 34/257  soft-tissue]
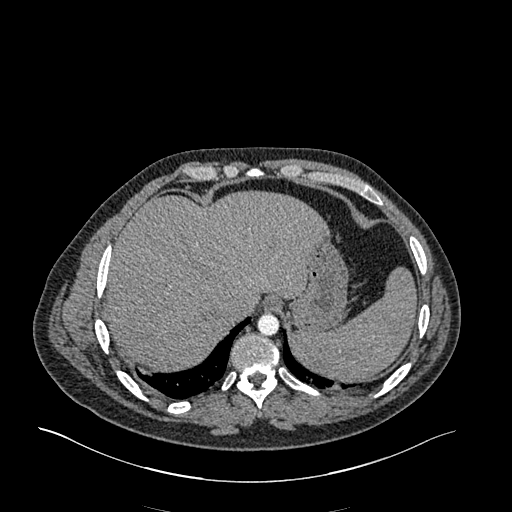
[im 45/257  lung]
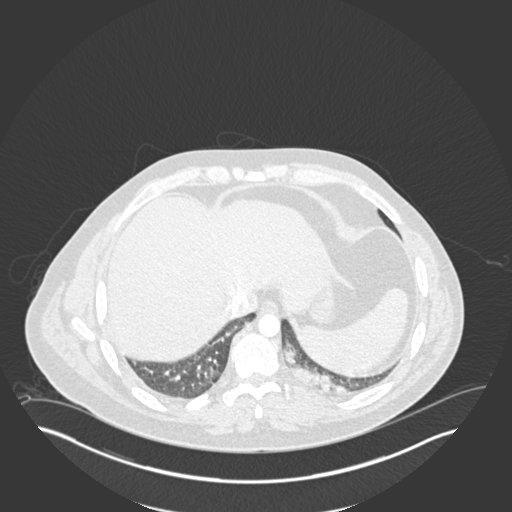
[im 67/257  soft-tissue]
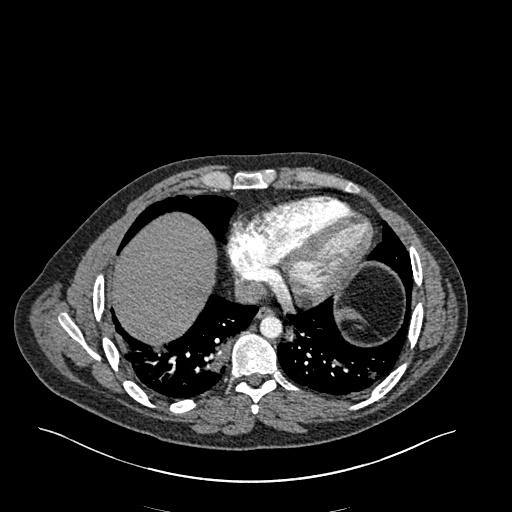
[im 90/257  lung]
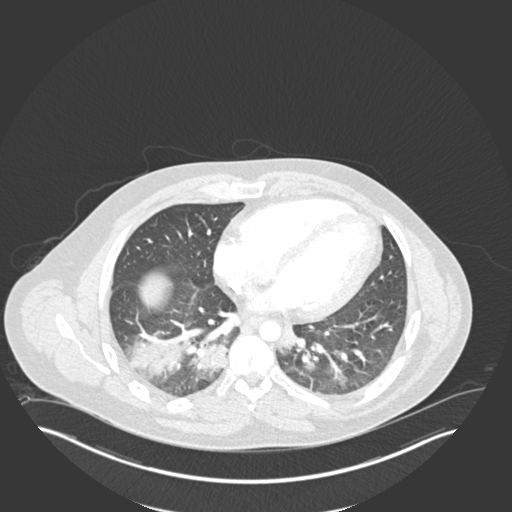
[im 101/257  soft-tissue]
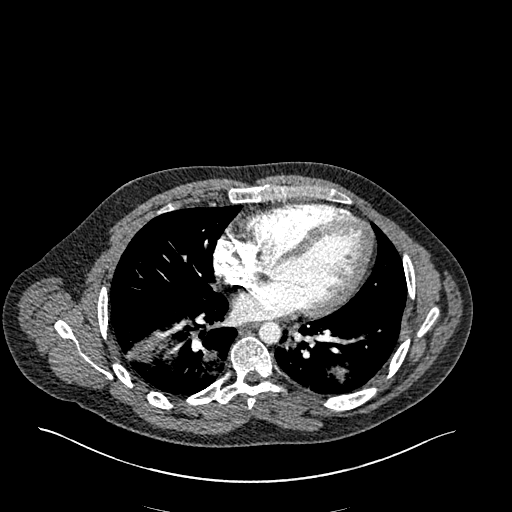
[im 123/257  lung]
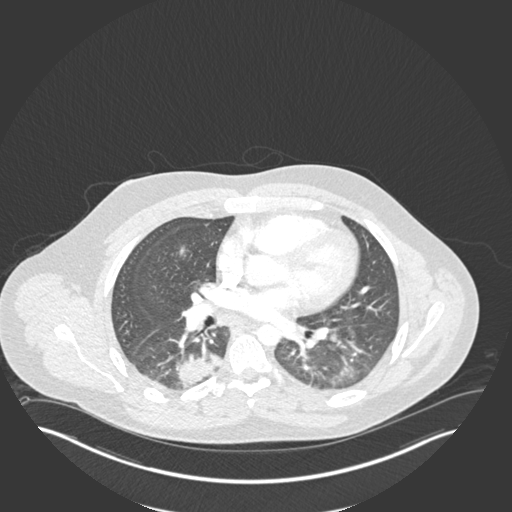
[im 134/257  soft-tissue]
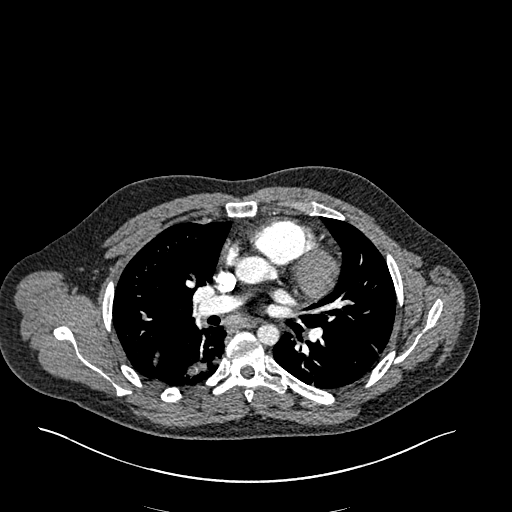
[im 156/257  lung]
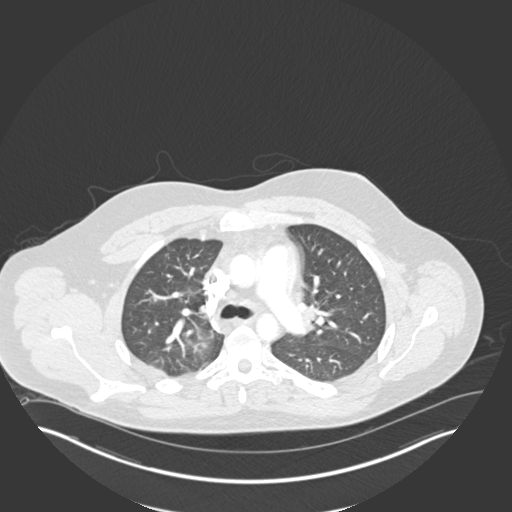
[im 167/257  soft-tissue]
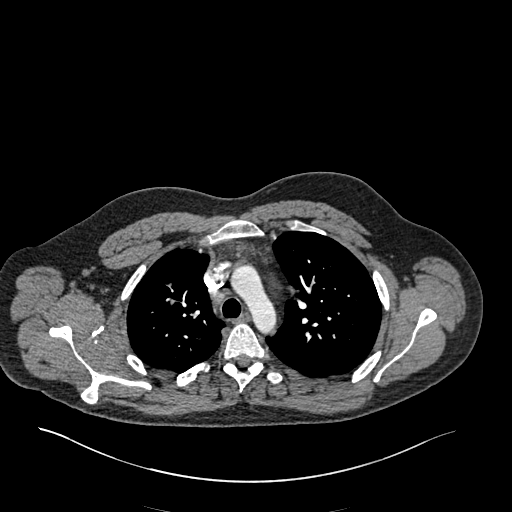
[im 190/257  lung]
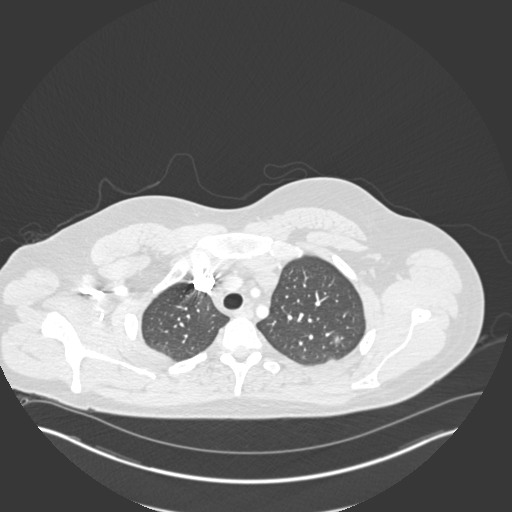
[im 212/257  soft-tissue]
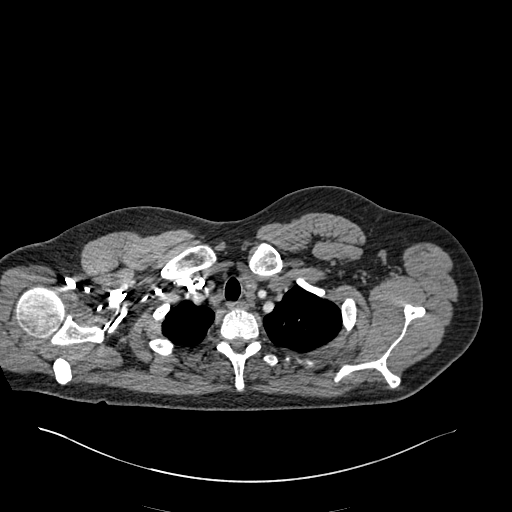
[im 223/257  lung]
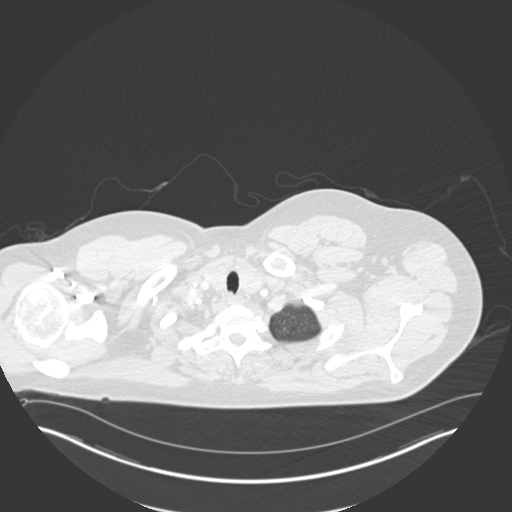
[im 245/257  soft-tissue]
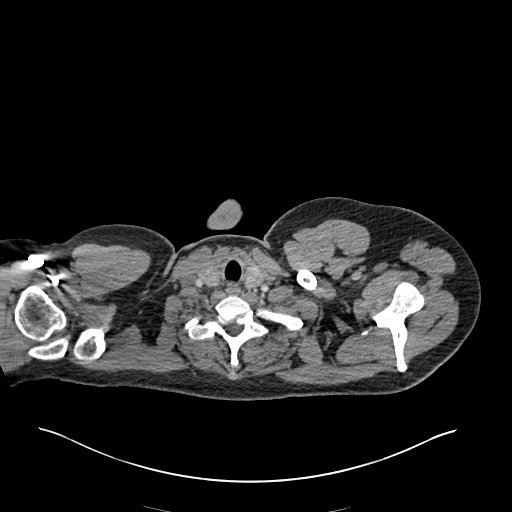

[Series 7: coronal mpr · coronal · 0.53mm/px · 3 of 143 slices shown]
[im 36/143  soft-tissue]
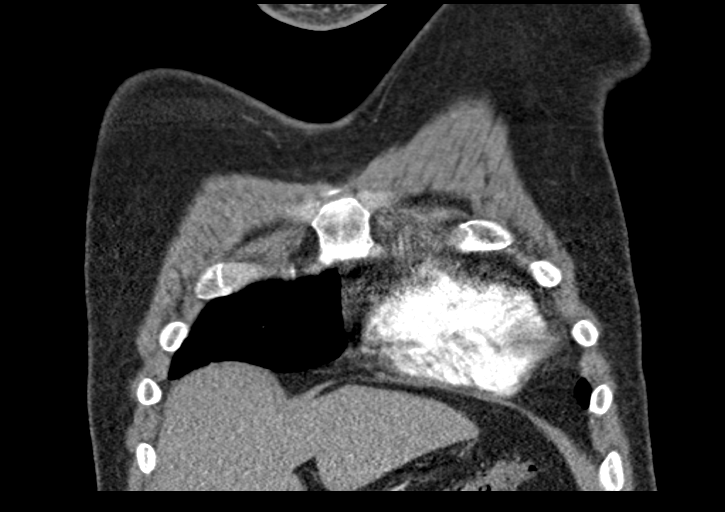
[im 72/143  soft-tissue]
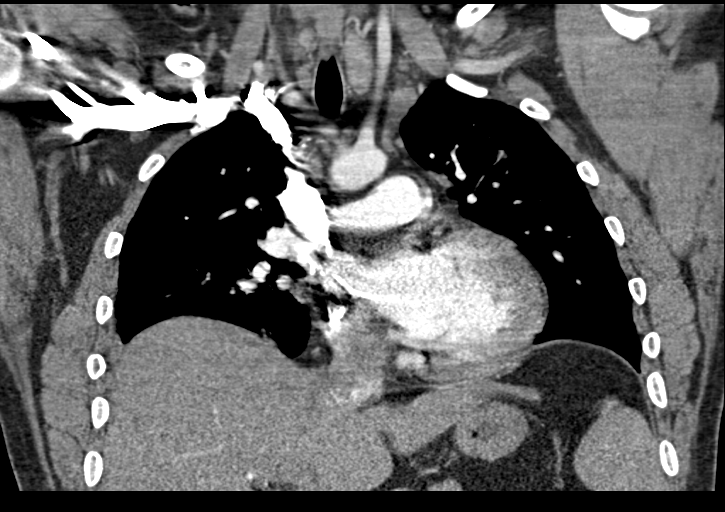
[im 107/143  soft-tissue]
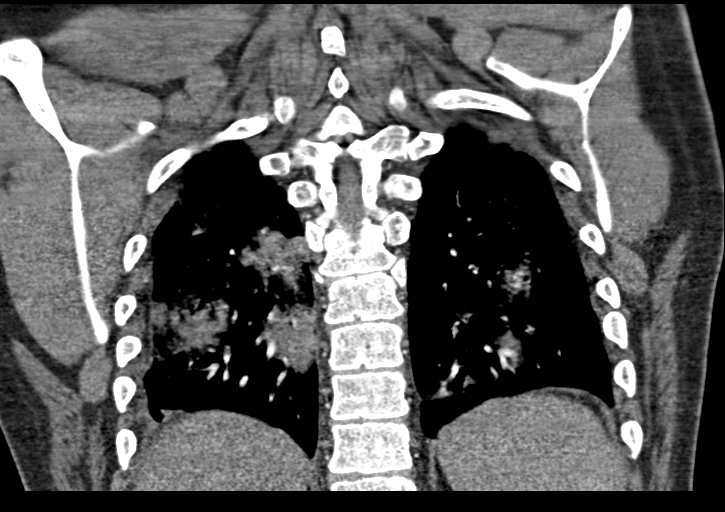

[17 of 46 positions shown; findings below may reference images not displayed]

FINDINGS: Cardiovascular: There is satisfactory opacification of the pulmonary
arteries to the segmental level. There is no evidence of a pulmonary
embolism.

Heart is normal in size and configuration. No pericardial effusion.
Normal great vessels.

Mediastinum/Nodes: No neck base, axillary, mediastinal or hilar
masses. No pathologically enlarged lymph nodes. There are prominent,
but subcentimeter nodes. 9 mm short axis right infrahilar node, 9 mm
short axis right subcarinal node and 9 mm short axis right
paratracheal node. Trachea and esophagus are unremarkable.

Lungs/Pleura: There multiple areas peribronchovascular confluent
opacity, most notably in the right lower lobe several of these have
rounded configurations. All lobes are involved.

No evidence of pulmonary edema. No pleural effusion or pneumothorax.

Upper Abdomen: Unremarkable.

Musculoskeletal: No chest wall abnormality. No acute or significant
osseous findings.

Review of the MIP images confirms the above findings.
IMPRESSION: 1. No evidence of a pulmonary embolism.
2. Bilateral, peripheral, basilar predominant and somewhat rounded
areas multifocal consolidation. Some areas have peripheral
ground-glass opacities. This is a spectrum of findings in the lungs
which can be seen with acute atypical infection (as well as other
non-infectious etiologies). In particular, viral pneumonia
(including CHIAF-MW) should be considered in the appropriate
clinical setting. I spoke with the ER clinician in charge of this
patient's care, Ingunn Harpa Ronlor. The patient has tested positive
for CHIAF-MW. The CT findings are consistent with that diagnosis.
